# Patient Record
Sex: Female | Born: 1965 | Race: White | Hispanic: No | Marital: Married | Smoking: Current some day smoker
Health system: Southern US, Community
[De-identification: ages and names within clinical notes are randomized; demographics above are authoritative.]

## PROBLEM LIST (undated history)

## (undated) DIAGNOSIS — L02412 Cutaneous abscess of left axilla: Secondary | ICD-10-CM

## (undated) DIAGNOSIS — N871 Moderate cervical dysplasia: Secondary | ICD-10-CM

## (undated) DIAGNOSIS — E785 Hyperlipidemia, unspecified: Secondary | ICD-10-CM

## (undated) HISTORY — DX: Cutaneous abscess of left axilla: L02.412

## (undated) HISTORY — DX: Moderate cervical dysplasia: N87.1

---

## 1998-03-09 ENCOUNTER — Encounter: Admission: RE | Admit: 1998-03-09 | Discharge: 1998-03-09 | Payer: Self-pay | Admitting: *Deleted

## 2000-04-21 HISTORY — PX: CERVICAL BIOPSY  W/ LOOP ELECTRODE EXCISION: SUR135

## 2000-05-29 ENCOUNTER — Other Ambulatory Visit: Admission: RE | Admit: 2000-05-29 | Discharge: 2000-05-29 | Payer: Self-pay | Admitting: *Deleted

## 2000-05-29 ENCOUNTER — Encounter (INDEPENDENT_AMBULATORY_CARE_PROVIDER_SITE_OTHER): Payer: Self-pay | Admitting: Specialist

## 2000-09-03 ENCOUNTER — Other Ambulatory Visit: Admission: RE | Admit: 2000-09-03 | Discharge: 2000-09-03 | Payer: Self-pay | Admitting: *Deleted

## 2000-09-03 ENCOUNTER — Encounter (INDEPENDENT_AMBULATORY_CARE_PROVIDER_SITE_OTHER): Payer: Self-pay

## 2000-09-30 ENCOUNTER — Other Ambulatory Visit: Admission: RE | Admit: 2000-09-30 | Discharge: 2000-09-30 | Payer: Self-pay | Admitting: *Deleted

## 2000-09-30 ENCOUNTER — Encounter (INDEPENDENT_AMBULATORY_CARE_PROVIDER_SITE_OTHER): Payer: Self-pay | Admitting: Specialist

## 2000-10-03 ENCOUNTER — Emergency Department (HOSPITAL_COMMUNITY): Admission: EM | Admit: 2000-10-03 | Discharge: 2000-10-03 | Payer: Self-pay | Admitting: Emergency Medicine

## 2001-01-20 ENCOUNTER — Other Ambulatory Visit: Admission: RE | Admit: 2001-01-20 | Discharge: 2001-01-20 | Payer: Self-pay | Admitting: *Deleted

## 2001-06-24 ENCOUNTER — Other Ambulatory Visit: Admission: RE | Admit: 2001-06-24 | Discharge: 2001-06-24 | Payer: Self-pay | Admitting: *Deleted

## 2003-01-13 ENCOUNTER — Other Ambulatory Visit: Admission: RE | Admit: 2003-01-13 | Discharge: 2003-01-13 | Payer: Self-pay | Admitting: Obstetrics and Gynecology

## 2003-02-27 ENCOUNTER — Encounter: Admission: RE | Admit: 2003-02-27 | Discharge: 2003-02-27 | Payer: Self-pay

## 2004-01-25 ENCOUNTER — Other Ambulatory Visit: Admission: RE | Admit: 2004-01-25 | Discharge: 2004-01-25 | Payer: Self-pay | Admitting: Gynecology

## 2006-02-04 ENCOUNTER — Ambulatory Visit: Payer: Self-pay | Admitting: General Practice

## 2007-02-09 ENCOUNTER — Ambulatory Visit: Payer: Self-pay | Admitting: General Practice

## 2007-03-29 ENCOUNTER — Other Ambulatory Visit: Admission: RE | Admit: 2007-03-29 | Discharge: 2007-03-29 | Payer: Self-pay | Admitting: Gynecology

## 2008-02-16 ENCOUNTER — Ambulatory Visit: Payer: Self-pay | Admitting: General Practice

## 2008-03-29 ENCOUNTER — Encounter: Payer: Self-pay | Admitting: Gynecology

## 2008-03-29 ENCOUNTER — Ambulatory Visit: Payer: Self-pay | Admitting: Gynecology

## 2008-03-29 ENCOUNTER — Other Ambulatory Visit: Admission: RE | Admit: 2008-03-29 | Discharge: 2008-03-29 | Payer: Self-pay | Admitting: Gynecology

## 2009-02-21 ENCOUNTER — Ambulatory Visit: Payer: Self-pay | Admitting: General Practice

## 2009-04-02 ENCOUNTER — Other Ambulatory Visit: Admission: RE | Admit: 2009-04-02 | Discharge: 2009-04-02 | Payer: Self-pay | Admitting: Gynecology

## 2009-04-02 ENCOUNTER — Ambulatory Visit: Payer: Self-pay | Admitting: Gynecology

## 2010-02-13 ENCOUNTER — Ambulatory Visit: Payer: Self-pay | Admitting: General Practice

## 2010-03-05 ENCOUNTER — Encounter: Admission: RE | Admit: 2010-03-05 | Discharge: 2010-03-05 | Payer: Self-pay | Admitting: Internal Medicine

## 2010-04-03 ENCOUNTER — Ambulatory Visit: Payer: Self-pay | Admitting: Gynecology

## 2010-04-18 ENCOUNTER — Other Ambulatory Visit
Admission: RE | Admit: 2010-04-18 | Discharge: 2010-04-18 | Payer: Self-pay | Source: Home / Self Care | Admitting: Gynecology

## 2010-04-26 ENCOUNTER — Ambulatory Visit
Admission: RE | Admit: 2010-04-26 | Discharge: 2010-04-26 | Payer: Self-pay | Source: Home / Self Care | Attending: Gynecology | Admitting: Gynecology

## 2011-02-12 ENCOUNTER — Ambulatory Visit: Payer: Self-pay

## 2011-05-16 ENCOUNTER — Encounter: Payer: Self-pay | Admitting: Gynecology

## 2011-06-06 ENCOUNTER — Other Ambulatory Visit (HOSPITAL_COMMUNITY)
Admission: RE | Admit: 2011-06-06 | Discharge: 2011-06-06 | Disposition: A | Discharge status: Home / Self Care | Payer: BC Managed Care – PPO | Source: Ambulatory Visit | Attending: Gynecology | Admitting: Gynecology

## 2011-06-06 ENCOUNTER — Ambulatory Visit (INDEPENDENT_AMBULATORY_CARE_PROVIDER_SITE_OTHER): Payer: BC Managed Care – PPO | Admitting: Gynecology

## 2011-06-06 ENCOUNTER — Encounter: Payer: Self-pay | Admitting: Gynecology

## 2011-06-06 VITALS — BP 110/70 | Ht 65.25 in | Wt 133.0 lb

## 2011-06-06 DIAGNOSIS — Z01419 Encounter for gynecological examination (general) (routine) without abnormal findings: Secondary | ICD-10-CM | POA: Insufficient documentation

## 2011-06-06 DIAGNOSIS — N951 Menopausal and female climacteric states: Secondary | ICD-10-CM

## 2011-06-06 DIAGNOSIS — N871 Moderate cervical dysplasia: Secondary | ICD-10-CM | POA: Insufficient documentation

## 2011-06-06 LAB — CBC WITH DIFFERENTIAL/PLATELET
Basophils Absolute: 0.1 10*3/uL (ref 0.0–0.1)
Basophils Relative: 2 % — ABNORMAL HIGH (ref 0–1)
Eosinophils Absolute: 0.3 10*3/uL (ref 0.0–0.7)
Eosinophils Relative: 3 % (ref 0–5)
HCT: 46.3 % — ABNORMAL HIGH (ref 36.0–46.0)
Hemoglobin: 15.3 g/dL — ABNORMAL HIGH (ref 12.0–15.0)
Lymphocytes Relative: 33 % (ref 12–46)
Lymphs Abs: 2.4 10*3/uL (ref 0.7–4.0)
MCH: 28.4 pg (ref 26.0–34.0)
MCHC: 33 g/dL (ref 30.0–36.0)
MCV: 85.9 fL (ref 78.0–100.0)
Monocytes Absolute: 0.7 10*3/uL (ref 0.1–1.0)
Monocytes Relative: 10 % (ref 3–12)
Neutro Abs: 3.9 10*3/uL (ref 1.7–7.7)
Neutrophils Relative %: 53 % (ref 43–77)
Platelets: 292 10*3/uL (ref 150–400)
RBC: 5.39 MIL/uL — ABNORMAL HIGH (ref 3.87–5.11)
RDW: 12.9 % (ref 11.5–15.5)
WBC: 7.4 10*3/uL (ref 4.0–10.5)

## 2011-06-06 LAB — URINALYSIS W MICROSCOPIC + REFLEX CULTURE
Bilirubin Urine: NEGATIVE
Casts: NONE SEEN
Crystals: NONE SEEN
Glucose, UA: NEGATIVE mg/dL
Ketones, ur: NEGATIVE mg/dL
Leukocytes, UA: NEGATIVE
Nitrite: NEGATIVE
Protein, ur: NEGATIVE mg/dL
Specific Gravity, Urine: 1.015 (ref 1.005–1.030)
Urobilinogen, UA: 0.2 mg/dL (ref 0.0–1.0)
pH: 5.5 (ref 5.0–8.0)

## 2011-06-06 LAB — LIPID PANEL
Cholesterol: 204 mg/dL — ABNORMAL HIGH (ref 0–200)
HDL: 53 mg/dL (ref >39)
LDL Cholesterol: 136 mg/dL — ABNORMAL HIGH (ref 0–99)
Total CHOL/HDL Ratio: 3.8 Ratio
Triglycerides: 76 mg/dL (ref <150)
VLDL: 15 mg/dL (ref 0–40)

## 2011-06-06 LAB — FOLLICLE STIMULATING HORMONE: FSH: 118.4 m[IU]/mL — ABNORMAL HIGH

## 2011-06-06 LAB — PROLACTIN: Prolactin: 10.7 ng/mL

## 2011-06-06 LAB — TSH: TSH: 1.554 u[IU]/mL (ref 0.350–4.500)

## 2011-06-06 NOTE — Patient Instructions (Signed)
Remember to take either one of the following for calcium and vitamin D source: Caltrate Plus or Oscal, or Citracal or Viactiv. Take one daily.                                                                                              Menopause   Menopause is the normal time of life when menstrual periods stop completely. Menopause is complete when you have missed 12 consecutive menstrual periods. It usually occurs between the ages of 42 to 79, with an average age of 43. Very rarely does a woman develop menopause before 48 years old. At menopause, your ovaries stop producing the female hormones, estrogen and progesterone. This can cause undesirable symptoms and also affect your health. Sometimes the symptoms may occur 4 to 5 years before the menopause begins. There is no relationship between menopause and:  Oral contraceptives.   Number of children you had.   Race.   The age your menstrual periods started (menarche).  Heavy smokers and very thin women may develop menopause earlier in life. CAUSES  The ovaries stop producing the female hormones estrogen and progesterone.   Other causes include:   Surgery to remove both ovaries.   The ovaries stop functioning for no known reason.   Tumors of the pituitary gland in the brain.   Medical disease that affects the ovaries and hormone production.   Radiation treatment to the abdomen or pelvis.   Chemotherapy that affects the ovaries.  SYMPTOMS   Hot flashes.   Night sweats.   Decrease in sex drive.   Vaginal dryness and thinning of the vagina causing painful intercourse.   Dryness of the skin and developing wrinkles.   Headaches.   Tiredness.   Irritability.   Memory problems.   Weight gain.   Bladder infections.   Hair growth of the face and chest.   Infertility.  More serious symptoms include:  Loss of bone (osteoporosis) causing breaks (fractures).   Depression.   Hardening and narrowing of the arteries  (atherosclerosis) causing heart attacks and strokes.  DIAGNOSIS   When the menstrual periods have stopped for 12 straight months.   Physical exam.   Hormone studies of the blood.  TREATMENT  There are many treatment choices and nearly as many questions about them. The decisions to treat or not to treat menopausal changes is an individual choice made with your caregiver. Your caregiver can discuss the treatments with you. Together, you can decide which treatment will work best for you. Your treatment choices may include:   Hormone therapy (estorgen and progesterone).   Non-hormonal medications.   Treating the individual symptoms with medication (for example antidepressants for depression).   Herbal medications that may help specific symptoms.   Counseling by a psychiatrist or psychologist.   Group therapy.   Lifestyle changes including:   Eating healthy.   Regular exercise.   Limiting caffeine and alcohol.   Stress management and meditation.   No treatment.  HOME CARE INSTRUCTIONS   Take the medication your caregiver gives you as directed.   Get plenty of sleep and rest.  Exercise regularly.   Eat a diet that contains calcium (good for the bones) and soy products (acts like estrogen hormone).   Avoid alcoholic beverages.   Do not smoke.   If you have hot flashes, dress in layers.   Take supplements, calcium and vitamin D to strengthen bones.   You can use over-the-counter lubricants or moisturizers for vaginal dryness.   Group therapy is sometimes very helpful.   Acupuncture may be helpful in some cases.  SEEK MEDICAL CARE IF:   You are not sure you are in menopause.   You are having menopausal symptoms and need advice and treatment.   You are still having menstrual periods after age 27.   You have pain with intercourse.   Menopause is complete (no menstrual period for 12 months) and you develop vaginal bleeding.   You need a referral to a  specialist (gynecologist, psychiatrist or psychologist) for treatment.  SEEK IMMEDIATE MEDICAL CARE IF:   You have severe depression.   You have excessive vaginal bleeding.   You fell and think you have a broken bone.   You have pain when you urinate.   You develop leg or chest pain.   You have a fast pounding heart beat (palpitations).   You have severe headaches.   You develop vision problems.   You feel a lump in your breast.   You have abdominal pain or severe indigestion.  Document Released: 06/28/2003 Document Revised: 12/18/2010 Document Reviewed: 02/03/2008 [facility] Patient Information 2012 [place], [place].

## 2011-06-06 NOTE — Progress Notes (Signed)
Haley Blake 1966/01/24 [id number]   History:    46 y.o.  for annual exam with no complaints today. Patient did state her last menstrual period was in February this year and prior to that was in June 2011. She denies any vasomotor symptoms. Her last mammogram was in November 2012 which was normal. She does her monthly self breast examination. 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 She stopped smoking in 2012. Patient not using any form of contraception.  Past medical history,surgical history, family history and social history were all reviewed and documented in the EPIC chart.  Gynecologic History Patient's last menstrual period was 05/29/2011. Contraception: none Last Pap: 2011. Results were: normal Last mammogram: 2012. Results were: normal  Obstetric History OB History    Grav Para Term Preterm Abortions TAB SAB Ect Mult Living   2 2 2       2      # Outc Date GA Lbr Len/2nd Wgt Sex Del Anes PTL Lv   1 TRM     F SVD  No Yes   2 TRM     F SVD  No Yes       ROS:  Was performed and pertinent positives and negatives are included in the history.  Exam: chaperone present  BP 110/70  Ht 5' 5.25" (1.657 m)  Wt 133 lb (60.328 kg)  BMI 21.96 kg/m2  LMP 05/29/2011  Body mass index is 21.96 kg/(m^2).  General appearance : Well developed well nourished female. No acute distress HEENT: Neck supple, trachea midline, no carotid bruits, no thyroidmegaly Lungs: Clear to auscultation, no rhonchi or wheezes, or rib retractions  Heart: Regular rate and rhythm, no murmurs or gallops Breast:Examined in sitting and supine position were symmetrical in appearance, no palpable masses or tenderness,  no skin retraction, no nipple inversion, no nipple discharge, no skin discoloration, no axillary or supraclavicular lymphadenopathy Abdomen: no palpable masses or tenderness, no rebound or guarding Extremities: no edema  or skin discoloration or tenderness  Pelvic:  Bartholin, Urethra, Skene Glands: Within normal limits             Vagina: No gross lesions or discharge  Cervix: No gross lesions or discharge  Uterus  anteverted, normal size, shape and consistency, non-tender and mobile  Adnexa  Without masses or tenderness  Anus and perineum  normal   Rectovaginal  normal sphincter tone without palpated masses or tenderness             Hemoccult not done     Assessment/Plan:  46 y.o. female for annual exam perimenopausal. Patient with no vasomotor symptoms. Will check an [facility] today along with her fasting lipid profile. We'll also check a TSH and prolactin (weight stable and denies any galactorrhea, headaches or visual disturbances). We'll also check her CBC along with her urinalysis and Pap smear. We discussed importance of calcium and vitamin D for osteoporosis prevention and literature formation was provided. We discussed importance of weightbearing exercises 45 minutes to 2 times a week. She is in a great progress and was congratulated that she stopped smoking. Will notify her there is any abnormality within the above mentioned test otherwise we'll see her back in one year or when necessary. If her FSH is elevated and since she is asymptomatic we'll do nothing at the present time unless she becomes symptomatic.    Ok Edwards MD, 12:09 PM 06/06/2011

## 2011-06-08 LAB — URINE CULTURE
Colony Count: NO GROWTH
Organism ID, Bacteria: NO GROWTH

## 2012-01-19 ENCOUNTER — Ambulatory Visit: Payer: BC Managed Care – PPO

## 2012-01-19 ENCOUNTER — Ambulatory Visit (INDEPENDENT_AMBULATORY_CARE_PROVIDER_SITE_OTHER): Payer: BC Managed Care – PPO | Admitting: Internal Medicine

## 2012-01-19 VITALS — BP 125/78 | HR 70 | Temp 98.1°F | Resp 18 | Ht 65.0 in | Wt 135.6 lb

## 2012-01-19 DIAGNOSIS — M545 Low back pain: Secondary | ICD-10-CM

## 2012-01-19 DIAGNOSIS — R937 Abnormal findings on diagnostic imaging of other parts of musculoskeletal system: Secondary | ICD-10-CM

## 2012-01-19 MED ORDER — PREDNISONE 10 MG PO TABS: ORAL_TABLET | ORAL | Status: DC

## 2012-01-19 MED ORDER — CYCLOBENZAPRINE HCL 5 MG PO TABS: 5.0000 mg | ORAL_TABLET | Freq: Three times a day (TID) | ORAL | Status: DC | PRN

## 2012-01-19 MED ORDER — HYDROCODONE-ACETAMINOPHEN 5-325 MG PO TABS: 1.0000 | ORAL_TABLET | Freq: Four times a day (QID) | ORAL | Status: DC | PRN

## 2012-01-19 NOTE — Progress Notes (Signed)
   [address] [place] [address]   Phone [phone]  Subjective:    Patient ID: Haley Blake, female    DOB: [birth year], 46 y.o.   MRN: [id number]  HPI  Pt presents to clinic with 5 day h/o lumbar pain with radiation down both legs into bottoms of feet.  She was awoken from sleep Thursday with this pain and it has not improved since.  When she went to bed she had no problems and had not had an injury earlier that day.  She has taken off a few days from work because of the pain.  She has taken no meds and used no ice or heat.  The pain is in her lower back more on the R side but radiates down both legs.  She does not have numbness but does have tingling in her toes/bottom of her feet.  She has had no bowel/bladder problems.  She has increased pain when staying in a position for a period of time or when she changes position.  She has never had back problems in the past.  Review of Systems  Musculoskeletal: Positive for back pain and gait problem.  Neurological: Negative for weakness and numbness.       Objective:   Physical Exam  Vitals reviewed. Constitutional: She is oriented to person, place, and time. She appears well-developed and well-nourished.  HENT:  Head: Normocephalic and atraumatic.  Right Ear: External ear normal.  Left Ear: External ear normal.  Pulmonary/Chest: Effort normal.  Musculoskeletal:       Lumbar back: She exhibits tenderness and bony tenderness. She exhibits normal range of motion (pt able to almost fully flex and extend her back, she does have slight discomfort with extension), no swelling, no edema and no spasm.       Back:       Pt moves very slow to get out of chair and off of exam bed. Neg SLR.  Neurological: She is alert and oriented to person, place, and time. She has normal strength. She displays normal reflexes. No sensory deficit.  Reflex Scores:      Patellar reflexes are 3+ on the right side and 3+ on the left side.      Achilles reflexes  are 2+ on the right side and 2+ on the left side. Skin: Skin is warm and dry.  Psychiatric: She has a normal mood and affect. Her behavior is normal. Judgment and thought content normal.      [facility] reading (PRIMARY) by  Dr. Merla Riches.  Abnl area on L3L4 R pedicle.      Assessment & Plan:   1. Lumbar pain with radiation down right leg  DG Lumbar Spine Complete, predniSONE (DELTASONE) 10 MG tablet, HYDROcodone-acetaminophen (NORCO/VICODIN) 5-325 MG per tablet, cyclobenzaprine (FLEXERIL) 5 MG tablet  2. Abnormal x-ray of lumbar spine     Unsure exact etiology of pain.  Most likely is musculoskeletal but because of no injury known and pain bilaterally will want to have close follow-up with patient.  Pt can use heat/ice to area for pain relief.  D/w pt decrease lifting and reaching overhead to extend lumbar spine.  Will recheck pt in 1 wk unless pain/symptoms change/worsen.  Pt agrees and understands the above.  Pt was discussed with Dr. Merla Riches.

## 2012-01-26 ENCOUNTER — Ambulatory Visit (INDEPENDENT_AMBULATORY_CARE_PROVIDER_SITE_OTHER): Payer: BC Managed Care – PPO | Admitting: Internal Medicine

## 2012-01-26 VITALS — BP 120/72 | HR 68 | Temp 97.8°F | Resp 18 | Ht 67.0 in | Wt 135.0 lb

## 2012-01-26 DIAGNOSIS — R209 Unspecified disturbances of skin sensation: Secondary | ICD-10-CM

## 2012-01-26 DIAGNOSIS — R202 Paresthesia of skin: Secondary | ICD-10-CM

## 2012-01-26 DIAGNOSIS — M545 Low back pain: Secondary | ICD-10-CM

## 2012-01-26 NOTE — Progress Notes (Signed)
   [address] [place] [address]   Phone [phone]  Subjective:    Patient ID: Haley Blake, female    DOB: July 17, 1965, 46 y.o.   MRN: [id number]  HPI  Pt presents to clinic for recheck of her back pain.  The pain is improved after her prednisone taper but she still has significant pain in her low back with pain down her R leg only now with paresthesias in her R toes.  She did not work last week but she did return today but did not do heavy lifting.  She has no bowel or bladder problems.  The pain increases with change in position or staying in 1 position for a period of time.  Review of Systems  Musculoskeletal: Positive for back pain and gait problem (pt with ambulation initiation).  Neurological: Positive for numbness (R foot).       Objective:   Physical Exam  Vitals reviewed. Constitutional: She is oriented to person, place, and time. She appears well-developed and well-nourished.  HENT:  Head: Normocephalic and atraumatic.  Right Ear: External ear normal.  Left Ear: External ear normal.  Pulmonary/Chest: Effort normal.  Musculoskeletal:       Lumbar back: She exhibits tenderness, bony tenderness and spasm. She exhibits normal range of motion.       Back:       Some pain with SI joint strain.  Neg SLR.  Slow to start walking but then she has a normal gait.  Slow to change position.   Neurological: She is alert and oriented to person, place, and time. She has normal strength. A sensory deficit (decreased sensation in R foot - esp bottom of foot and big toe but sensation change starts at mid foot.) is present.  Reflex Scores:      Patellar reflexes are 3+ on the right side and 3+ on the left side.      Achilles reflexes are 2+ on the right side and 2+ on the left side. Skin: Skin is warm and dry.  Psychiatric: She has a normal mood and affect. Her behavior is normal. Judgment and thought content normal.   Reviewed xray report which showed loss of disc height in  L5-S1.    Assessment & Plan:   1. Lumbar pain with radiation down right leg  MR Lumbar Spine Wo Contrast  2. Right leg paresthesias  MR Lumbar Spine Wo Contrast  3. Abnormal sensation of lower extremity  MR Lumbar Spine Wo Contrast   Pt to continue pain meds prn and muscle relaxers.  Will schedule a MRI due to sensory deficits.  Pt will try to return to her work but d/w her that if her pain starts to worsen she needs to stop or at least decrease her lifting, pushing/pulling at work.  If anything changes and things get worse (esp sensation, or bowel/bladder problems) rtc.  Will determine next step after MRI results.  Pt brings in FMLA paperwork for work missed last week and I will fill them out for pt and then contact her to come and pick them up.  D/w Dr. Merla Riches.

## 2012-01-27 ENCOUNTER — Telehealth: Payer: Self-pay

## 2012-01-27 NOTE — Telephone Encounter (Signed)
FYI, I have given her more specific back restrictions, and can fax note, however, I want to make sure this is what the patient wants me to do. I have left message for the patient to call me back.

## 2012-01-27 NOTE — Telephone Encounter (Signed)
Haley Blake states she discussed with patient, and it is okay to fax this, so I have faxed it.

## 2012-01-27 NOTE — Telephone Encounter (Signed)
I have corrected this letter to 5lb restriction per Dr Merla Riches I called her at work and could not contact her.

## 2012-01-27 NOTE — Telephone Encounter (Signed)
Patients employer, [employer], request specifics to work duties. States minimal lifting is not specific and needs an exact number of lbs. If she does not hear from the office before 11am patient will be sent home for the day. Please call at [phone] ex [phone] and a note will also need to be faxed to [phone]

## 2012-01-27 NOTE — Telephone Encounter (Signed)
This patient was evaluated by Benny Lennert and our agreed approach was to schedule an MRI to evaluate her sensory defects Haley Blake is filling out FMLA papers according to her note If they insist you can put a limit of 5 pounds on her lifting

## 2012-01-29 ENCOUNTER — Ambulatory Visit
Admission: RE | Admit: 2012-01-29 | Discharge: 2012-01-29 | Disposition: A | Discharge status: Home / Self Care | Payer: BC Managed Care – PPO | Source: Ambulatory Visit | Attending: Physician Assistant | Admitting: Physician Assistant

## 2012-01-29 DIAGNOSIS — M545 Pain in right leg: Secondary | ICD-10-CM

## 2012-01-29 DIAGNOSIS — R202 Paresthesia of skin: Secondary | ICD-10-CM

## 2012-01-29 DIAGNOSIS — R209 Unspecified disturbances of skin sensation: Secondary | ICD-10-CM

## 2012-01-30 ENCOUNTER — Telehealth: Payer: Self-pay

## 2012-01-30 NOTE — Telephone Encounter (Signed)
Haley Blake, can you please review results and comment?

## 2012-01-30 NOTE — Telephone Encounter (Signed)
PT STATES SHE HAD AN MRI AND WOULD LIKE TO KNOW RESULTS. PLEASE CALL [phone] OR [phone]

## 2012-01-31 ENCOUNTER — Telehealth: Payer: Self-pay | Admitting: Physician Assistant

## 2012-01-31 DIAGNOSIS — M545 Low back pain: Secondary | ICD-10-CM

## 2012-01-31 DIAGNOSIS — R202 Paresthesia of skin: Secondary | ICD-10-CM

## 2012-01-31 NOTE — Telephone Encounter (Signed)
[facility] Ana has called patient

## 2012-01-31 NOTE — Telephone Encounter (Signed)
I reviewed her MRI results and she needs to have a referral.  ? Need for nerve conduction studies.

## 2012-01-31 NOTE — Telephone Encounter (Signed)
I have reviewed and sent to the CMA/rad tech pool.

## 2012-02-03 ENCOUNTER — Ambulatory Visit (INDEPENDENT_AMBULATORY_CARE_PROVIDER_SITE_OTHER): Payer: BC Managed Care – PPO | Admitting: Family Medicine

## 2012-02-03 VITALS — BP 102/67 | HR 84 | Temp 98.1°F | Resp 16 | Ht 65.25 in | Wt 136.0 lb

## 2012-02-03 DIAGNOSIS — M549 Dorsalgia, unspecified: Secondary | ICD-10-CM

## 2012-02-03 MED ORDER — NABUMETONE 500 MG PO TABS: 500.0000 mg | ORAL_TABLET | Freq: Two times a day (BID) | ORAL | Status: DC

## 2012-02-03 NOTE — Progress Notes (Signed)
[facility]:  Office Visit  Chief Complaint:  Chief Complaint  Patient presents with  . Follow-up    We  have started on a referral for pt to [facility] (see phone message) but pt's FMLA runs out tomorrow    HPI: Haley Blake is a 46 y.o. female who complains of  Low back pain, worse with walking, low back and radiates to hips and down legs. She has some tingling in her right toes. She tried to go to work but after Thursday she took Friday off due to the pain. Her MRI shows minimal dic protrusion at L5-S1 without nerve compression. She denies and incontinence. Has some numbness down right leg in foot but was given steroid the first visit and she said it may her crazy, hot on the inside.   Past Medical History  Diagnosis Date  . CIN II (cervical intraepithelial neoplasia II)    Past Surgical History  Procedure Date  . Cervical biopsy  w/ loop electrode excision 2002   History   Social History  . Marital Status: Married    Spouse Name: N/A    Number of Children: N/A  . Years of Education: N/A   Social History Main Topics  . Smoking status: Current Some Day Smoker -- 0.2 packs/day  . Smokeless tobacco: Never Used   Comment: stopped smoking 2012  . Alcohol Use: No  . Drug Use: No  . Sexually Active: Yes    Birth Control/ Protection: None   Other Topics Concern  . None   Social History Narrative  . None   Family History  Problem Relation Age of Onset  . Diabetes Father   . Diabetes Sister   . Diabetes Brother    No Known Allergies Prior to Admission medications   Medication Sig Start Date End Date Taking? Authorizing Provider  Multiple Vitamin (MULTIVITAMIN) tablet Take 1 tablet by mouth daily.   Yes Historical Provider, MD  cyclobenzaprine (FLEXERIL) 5 MG tablet Take 1 tablet (5 mg total) by mouth 3 (three) times daily as needed for muscle spasms. 01/19/12   Morrell Riddle, PA-C  HYDROcodone-acetaminophen (NORCO/VICODIN) 5-325 MG per tablet Take 1  tablet by mouth every 6 (six) hours as needed for pain. 01/19/12   Morrell Riddle, PA-C     ROS: The patient denies fevers, chills, night sweats, unintentional weight loss, chest pain, palpitations, wheezing, dyspnea on exertion, nausea, vomiting, abdominal pain, dysuria, hematuria, melena, numbness, weakness, or tingling.   All other systems have been reviewed and were otherwise negative with the exception of those mentioned in the HPI and as above.    PHYSICAL EXAM: Filed Vitals:   02/03/12 1801  BP: 102/67  Pulse: 84  Temp: 98.1 F (36.7 C)  Resp: 16   Filed Vitals:   02/03/12 1801  Height: 5' 5.25" (1.657 m)  Weight: 136 lb (61.689 kg)   Body mass index is 22.46 kg/(m^2).  General: Alert, no acute distress HEENT:  Normocephalic, atraumatic, oropharynx patent.  Cardiovascular:  Regular rate and rhythm, no rubs murmurs or gallops.  No Carotid bruits, radial pulse intact. No pedal edema.  Respiratory: Clear to auscultation bilaterally.  No wheezes, rales, or rhonchi.  No cyanosis, no use of accessory musculature GI: No organomegaly, abdomen is soft and non-tender, positive bowel sounds.  No masses. Skin: No rashes. Neurologic: Facial musculature symmetric. Psychiatric: Patient is appropriate throughout our interaction. Lymphatic: No cervical lymphadenopathy Musculoskeletal: Gait intact. Right leg + straight leg +  right buttock tenderness + 2/2 DTR ROm painful with extension, decreases ROM   LABS: Results for orders placed in visit on 06/06/11  LIPID PANEL      Component Value Range   Cholesterol 204 (*) 0 - 200 mg/dL   Triglycerides 76  <045 mg/dL   HDL 53  >40 mg/dL   Total CHOL/HDL Ratio 3.8     VLDL 15  0 - 40 mg/dL   LDL Cholesterol 981 (*) 0 - 99 mg/dL  FOLLICLE STIMULATING HORMONE      Component Value Range   FSH 118.4 (*)   CBC WITH DIFFERENTIAL      Component Value Range   WBC 7.4  4.0 - 10.5 K/uL   RBC 5.39 (*) 3.87 - 5.11 MIL/uL   Hemoglobin 15.3 (*)  12.0 - 15.0 g/dL   HCT 19.1 (*) 47.8 - 29.5 %   MCV 85.9  78.0 - 100.0 fL   MCH 28.4  26.0 - 34.0 pg   MCHC 33.0  30.0 - 36.0 g/dL   RDW 62.1  30.8 - 65.7 %   Platelets 292  150 - 400 K/uL   Neutrophils Relative 53  43 - 77 %   Neutro Abs 3.9  1.7 - 7.7 K/uL   Lymphocytes Relative 33  12 - 46 %   Lymphs Abs 2.4  0.7 - 4.0 K/uL   Monocytes Relative 10  3 - 12 %   Monocytes Absolute 0.7  0.1 - 1.0 K/uL   Eosinophils Relative 3  0 - 5 %   Eosinophils Absolute 0.3  0.0 - 0.7 K/uL   Basophils Relative 2 (*) 0 - 1 %   Basophils Absolute 0.1  0.0 - 0.1 K/uL   Smear Review Criteria for review not met    URINALYSIS WITH CULTURE REFLEX      Component Value Range   Color, Urine YELLOW  YELLOW   APPearance CLEAR  CLEAR   Specific Gravity, Urine 1.015  1.005 - 1.030   pH 5.5  5.0 - 8.0   Glucose, UA NEG  NEG mg/dL   Bilirubin Urine NEG  NEG   Ketones, ur NEG  NEG mg/dL   Hgb urine dipstick MOD (*) NEG   Protein, ur NEG  NEG mg/dL   Urobilinogen, UA 0.2  0.0 - 1.0 mg/dL   Nitrite NEG  NEG   Leukocytes, UA NEG  NEG   Squamous Epithelial / LPF FEW  RARE   Crystals NONE SEEN  NONE SEEN   Casts NONE SEEN  NONE SEEN   WBC, UA 0-2  <3 WBC/hpf   RBC / HPF 3-6 (*) <3 RBC/hpf   Bacteria, UA FEW (*) RARE  TSH      Component Value Range   TSH 1.554  0.350 - 4.500 uIU/mL  PROLACTIN      Component Value Range   Prolactin 10.7    URINE CULTURE      Component Value Range   Colony Count NO GROWTH     Organism ID, Bacteria NO GROWTH       EKG/XRAY:   Primary read interpreted by Dr. Conley Rolls at [facility].   ASSESSMENT/PLAN: Encounter Diagnosis  Name Primary?  . Back pain with radiation Yes   Rx [facility] referral pending Patient picked up FMLA paperwork and paid $15    [name], DO 02/03/2012 6:21 PM

## 2012-06-08 ENCOUNTER — Encounter: Payer: Self-pay | Admitting: Gynecology

## 2012-06-08 ENCOUNTER — Ambulatory Visit (INDEPENDENT_AMBULATORY_CARE_PROVIDER_SITE_OTHER): Payer: PRIVATE HEALTH INSURANCE | Admitting: Gynecology

## 2012-06-08 ENCOUNTER — Other Ambulatory Visit (HOSPITAL_COMMUNITY)
Admission: RE | Admit: 2012-06-08 | Discharge: 2012-06-08 | Disposition: A | Discharge status: Home / Self Care | Payer: PRIVATE HEALTH INSURANCE | Source: Ambulatory Visit | Attending: Gynecology | Admitting: Gynecology

## 2012-06-08 VITALS — BP 120/84 | Ht 65.0 in | Wt 141.0 lb

## 2012-06-08 DIAGNOSIS — N951 Menopausal and female climacteric states: Secondary | ICD-10-CM

## 2012-06-08 DIAGNOSIS — Z833 Family history of diabetes mellitus: Secondary | ICD-10-CM

## 2012-06-08 DIAGNOSIS — Z01419 Encounter for gynecological examination (general) (routine) without abnormal findings: Secondary | ICD-10-CM | POA: Insufficient documentation

## 2012-06-08 DIAGNOSIS — Z1151 Encounter for screening for human papillomavirus (HPV): Secondary | ICD-10-CM | POA: Insufficient documentation

## 2012-06-08 DIAGNOSIS — Z79890 Hormone replacement therapy (postmenopausal): Secondary | ICD-10-CM | POA: Insufficient documentation

## 2012-06-08 MED ORDER — MEDROXYPROGESTERONE ACETATE 10 MG PO TABS: ORAL_TABLET | ORAL | Status: DC

## 2012-06-08 MED ORDER — ESTRADIOL 0.52 MG/0.87 GM (0.06%) TD GEL: 1.0000 "application " | Freq: Every day | TRANSDERMAL | Status: DC

## 2012-06-08 NOTE — Progress Notes (Signed)
Haley Blake [birth year] [id number]   History:    48 y.o.  for annual gyn exam who has been complaining of vasomotor symptoms consisting of hot flashes mood swings and irritability as well as night sweats and insomnia. Last year an [facility] demonstrated that she was in the menopausal range with a value of 118. She stated her last menstrual period was April of 2012. Patient's currently being followed by the orthopedic service for lumbar herniated disc. She's currently not using any form of contraception. She stopped smoking in 2012.  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  Followup Pap smears have been normal. Patient's last mammogram 2012 [facility]. Patient frequently does her self breast examination. Patient's father and sister with history of diabetes. Patient's primary physician has been Dr. Cleta Alberts for which she has not seen him in over a year.   Past medical history,surgical history, family history and social history were all reviewed and documented in the EPIC chart.  Gynecologic History Patient's last menstrual period was 08/07/2011. Contraception: post menopausal status Last Pap: February 2013. Results were: normal Last mammogram: 2012. Results were: normal  Obstetric History OB History   Grav Para Term Preterm Abortions TAB SAB Ect Mult Living   2 2 2       2      # Outc Date GA Lbr Len/2nd Wgt Sex Del Anes PTL Lv   1 TRM     F SVD  No Yes   2 TRM     F SVD  No Yes       ROS: A ROS was performed and pertinent positives and negatives are included in the history.  GENERAL: No fevers or chills. HEENT: No change in vision, no earache, sore throat or sinus  congestion. NECK: No pain or stiffness. CARDIOVASCULAR: No chest pain or pressure. No palpitations. PULMONARY: No shortness of breath, cough or wheeze. GASTROINTESTINAL: No abdominal pain, nausea, vomiting or diarrhea, melena or bright red blood per rectum. GENITOURINARY: No urinary frequency, urgency, hesitancy or dysuria. MUSCULOSKELETAL: No joint or muscle pain, no back pain, no recent trauma. DERMATOLOGIC: No rash, no itching, no lesions. ENDOCRINE: No polyuria, polydipsia, no heat or cold intolerance. No recent change in weight. HEMATOLOGICAL: No anemia or easy bruising or bleeding. NEUROLOGIC: No headache, seizures, numbness, tingling or weakness. PSYCHIATRIC: No depression, no loss of interest in normal activity or change in sleep pattern.     Exam: chaperone present  BP 120/84  Ht 5\' 5"  (1.651 m)  Wt 141 lb (63.957 kg)  BMI 23.46 kg/m2  LMP 08/07/2011  Body mass index is 23.46 kg/(m^2).  General appearance : Well developed well nourished female. No acute distress HEENT: Neck supple, trachea midline, no carotid bruits, no thyroidmegaly Lungs: Clear to auscultation, no rhonchi or wheezes, or rib retractions  Heart: Regular rate and rhythm, no murmurs or gallops Breast:Examined in sitting and supine position were symmetrical in appearance, no palpable masses or tenderness,  no skin retraction, no nipple inversion, no nipple discharge, no skin discoloration, no axillary or supraclavicular lymphadenopathy Abdomen: no palpable masses or tenderness, no rebound or guarding Extremities: no edema or skin discoloration or tenderness  Pelvic:  Bartholin, Urethra, Skene Glands: Within normal limits  Vagina: No gross lesions or discharge  Cervix: No gross lesions or discharge  Uterus  anteverted, normal size, shape and consistency, non-tender and mobile  Adnexa  Without masses or tenderness  Anus and perineum  normal   Rectovaginal  normal sphincter tone without palpated masses or  tenderness             Hemoccult not indicated     Assessment/Plan:  47 y.o. female for annual exam with signs and symptoms and clinical evidence of the menopause. We had a lengthy discussion on hormone replacement therapy and potential risk of breast cancer. We discussed the women's health initiative study.  We also discussed different methods of administration of hormone replacement therapy. Patient accepts and would like to start hormone replacement therapy. She'll be started on elestrin 0.06% transdermal (one pump) to one arm daily with the addition of Provera 10 mg for 10 days of the month. Because of her history of dysplasia in the past we will continue to do Pap smears annually. Pap smear was done today. The following labs were work today: Hemoglobin A1c, CBC, urinalysis, fasting lipid profile and TSH. Requisition to schedule mammogram was provided as well. We discussed importance of calcium vitamin D and regular exercise for osteoporosis prevention.    Ok Edwards MD, 11:35 AM 06/08/2012

## 2012-06-08 NOTE — Patient Instructions (Signed)
Menopause Menopause is the normal time of life when menstrual periods stop completely. Menopause is complete when you have missed 12 consecutive menstrual periods. It usually occurs between the ages of 48 to 55, with an average age of 51. Very rarely does a woman develop menopause before 49 years old. At menopause, your ovaries stop producing the female hormones, estrogen and progesterone. This can cause undesirable symptoms and also affect your health. Sometimes the symptoms may occur 4 to 5 years before the menopause begins. There is no relationship between menopause and:  Oral contraceptives.  Number of children you had.  Race.  The age your menstrual periods started (menarche). Heavy smokers and very thin women may develop menopause earlier in life. CAUSES  The ovaries stop producing the female hormones estrogen and progesterone.  Other causes include:  Surgery to remove both ovaries.  The ovaries stop functioning for no known reason.  Tumors of the pituitary gland in the brain.  Medical disease that affects the ovaries and hormone production.  Radiation treatment to the abdomen or pelvis.  Chemotherapy that affects the ovaries. SYMPTOMS   Hot flashes.  Night sweats.  Decrease in sex drive.  Vaginal dryness and thinning of the vagina causing painful intercourse.  Dryness of the skin and developing wrinkles.  Headaches.  Tiredness.  Irritability.  Memory problems.  Weight gain.  Bladder infections.  Hair growth of the face and chest.  Infertility. More serious symptoms include:  Loss of bone (osteoporosis) causing breaks (fractures).  Depression.  Hardening and narrowing of the arteries (atherosclerosis) causing heart attacks and strokes. DIAGNOSIS   When the menstrual periods have stopped for 12 straight months.  Physical exam.  Hormone studies of the blood. TREATMENT  There are many treatment choices and nearly as many questions about them.  The decisions to treat or not to treat menopausal changes is an individual choice made with your caregiver. Your caregiver can discuss the treatments with you. Together, you can decide which treatment will work best for you. Your treatment choices may include:   Hormone therapy (estorgen and progesterone).  Non-hormonal medications.  Treating the individual symptoms with medication (for example antidepressants for depression).  Herbal medications that may help specific symptoms.  Counseling by a psychiatrist or psychologist.  Group therapy.  Lifestyle changes including:  Eating healthy.  Regular exercise.  Limiting caffeine and alcohol.  Stress management and meditation.  No treatment. HOME CARE INSTRUCTIONS   Take the medication your caregiver gives you as directed.  Get plenty of sleep and rest.  Exercise regularly.  Eat a diet that contains calcium (good for the bones) and soy products (acts like estrogen hormone).  Avoid alcoholic beverages.  Do not smoke.  If you have hot flashes, dress in layers.  Take supplements, calcium and vitamin D to strengthen bones.  You can use over-the-counter lubricants or moisturizers for vaginal dryness.  Group therapy is sometimes very helpful.  Acupuncture may be helpful in some cases. SEEK MEDICAL CARE IF:   You are not sure you are in menopause.  You are having menopausal symptoms and need advice and treatment.  You are still having menstrual periods after age 55.  You have pain with intercourse.  Menopause is complete (no menstrual period for 12 months) and you develop vaginal bleeding.  You need a referral to a specialist (gynecologist, psychiatrist or psychologist) for treatment. SEEK IMMEDIATE MEDICAL CARE IF:   You have severe depression.  You have excessive vaginal bleeding.  You fell and   think you have a broken bone.  You have pain when you urinate.  You develop leg or chest pain.  You have a fast  pounding heart beat (palpitations).  You have severe headaches.  You develop vision problems.  You feel a lump in your breast.  You have abdominal pain or severe indigestion. Document Released: 06/28/2003 Document Revised: 06/30/2011 Document Reviewed: 02/03/2008 Exit[employer] Patient Information 2013 [employer].   Hormone Therapy At menopause, your body begins making less estrogen and progesterone hormones. This causes the body to stop having menstrual periods. This is because estrogen and progesterone hormones control your periods and menstrual cycle. A lack of estrogen may cause symptoms such as:  Hot flushes (or hot flashes).  Vaginal dryness.  Dry skin.  Loss of sex drive.  Risk of bone loss (osteoporosis). When this happens, you may choose to take hormone therapy to get back the estrogen lost during menopause. When the hormone estrogen is given alone, it is usually referred to as ET (Estrogen Therapy). When the hormone progestin is combined with estrogen, it is generally called HT (Hormone Therapy). This was formerly known as hormone replacement therapy (HRT). Your caregiver can help you make a decision on what will be best for you. The decision to use HT seems to change often as new studies are done. Many studies do not agree on the benefits of hormone replacement therapy. LIKELY BENEFITS OF HT INCLUDE PROTECTION FROM:  Hot Flushes (also called hot flashes) - A hot flush is a sudden feeling of heat that spreads over the face and body. The skin may redden like a blush. It is connected with sweats and sleep disturbance. Women going through menopause may have hot flushes a few times a month or several times per day depending on the woman.  Osteoporosis (bone loss)- Estrogen helps guard against bone loss. After menopause, a woman's bones slowly lose calcium and become weak and brittle. As a result, bones are more likely to break. The hip, wrist, and spine are affected most often.  Hormone therapy can help slow bone loss after menopause. Weight bearing exercise and taking calcium with vitamin D also can help prevent bone loss. There are also medications that your caregiver can prescribe that can help prevent osteoporosis.  Vaginal Dryness - Loss of estrogen causes changes in the vagina. Its lining may become thin and dry. These changes can cause pain and bleeding during sexual intercourse. Dryness can also lead to infections. This can cause burning and itching. (Vaginal estrogen treatment can help relieve pain, itching, and dryness.)  Urinary Tract Infections are more common after menopause because of lack of estrogen. Some women also develop urinary incontinence because of low estrogen levels in the vagina and bladder.  Possible other benefits of estrogen include a positive effect on mood and short-term memory in women. RISKS AND COMPLICATIONS  Using estrogen alone without progesterone causes the lining of the uterus to grow. This increases the risk of lining of the uterus (endometrial) cancer. Your caregiver should give another hormone called progestin if you have a uterus.  Women who take combined (estrogen and progestin) HT appear to have an increased risk of breast cancer. The risk appears to be small, but increases throughout the time that HT is taken.  Combined therapy also makes the breast tissue slightly denser which makes it harder to read mammograms (breast X-rays).  Combined, estrogen and progesterone therapy can be taken together every day, in which case there may be spotting of blood. HT   therapy can be taken cyclically in which case you will have menstrual periods. Cyclically means HT is taken for a set amount of days, then not taken, then this process is repeated.  HT may increase the risk of stroke, heart attack, breast cancer and forming blood clots in your leg.  Transdermal estrogen (estrogen that is absorbed through the skin with a patch or a cream) may  have more positive results with:  Cholesterol.  Blood pressure.  Blood clots. Having the following conditions may indicate you should not have HT:  Endometrial cancer.  Liver disease.  Breast cancer.  Heart disease.  History of blood clots.  Stroke. TREATMENT   If you choose to take HT and have a uterus, usually estrogen and progestin are prescribed.  Your caregiver will help you decide the best way to take the medications.  Possible ways to take estrogen include:  Pills.  Patches.  Gels.  Sprays.  Vaginal estrogen cream, rings and tablets.  It is best to take the lowest dose possible that will help your symptoms and take them for the shortest period of time that you can.  Hormone therapy can help relieve some of the problems (symptoms) that affect women at menopause. Before making a decision about HT, talk to your caregiver about what is best for you. Be well informed and comfortable with your decisions. HOME CARE INSTRUCTIONS   Follow your caregivers advice when taking the medications.  A Pap test is done to screen for cervical cancer.  The first Pap test should be done at age 21.  Between ages 21 and 29, Pap tests are repeated every 2 years.  Beginning at age 30, you are advised to have a Pap test every 3 years as long as your past 3 Pap tests have been normal.  Some women have medical problems that increase the chance of getting cervical cancer. Talk to your caregiver about these problems. It is especially important to talk to your caregiver if a new problem develops soon after your last Pap test. In these cases, your caregiver may recommend more frequent screening and Pap tests.  The above recommendations are the same for women who have or have not gotten the vaccine for HPV (Human Papillomavirus).  If you had a hysterectomy for a problem that was not a cancer or a condition that could lead to cancer, then you no longer need Pap tests. However, even if  you no longer need a Pap test, a regular exam is a good idea to make sure no other problems are starting.   If you are between ages 65 and 70, and you have had normal Pap tests going back 10 years, you no longer need Pap tests. However, even if you no longer need a Pap test, a regular exam is a good idea to make sure no other problems are starting.   If you have had past treatment for cervical cancer or a condition that could lead to cancer, you need Pap tests and screening for cancer for at least 20 years after your treatment.  If Pap tests have been discontinued, risk factors (such as a new sexual partner) need to be re-assessed to determine if screening should be resumed.  Some women may need screenings more often if they are at high risk for cervical cancer.  Get mammograms done as per the advice of your caregiver. SEEK IMMEDIATE MEDICAL CARE IF:  You develop abnormal vaginal bleeding.  You have pain or swelling in your legs,   shortness of breath, or chest pain.  You develop dizziness or headaches.  You have lumps or changes in your breasts or armpits.  You have slurred speech.  You develop weakness or numbness of your arms or legs.  You have pain, burning, or bleeding when urinating.  You develop abdominal pain. Document Released: 01/04/2003 Document Revised: 06/30/2011 Document Reviewed: 04/24/2010 Exit[employer] Patient Information 2013 [employer].   

## 2012-06-09 LAB — URINALYSIS W MICROSCOPIC + REFLEX CULTURE
Bacteria, UA: NONE SEEN
Casts: NONE SEEN
Crystals: NONE SEEN
Glucose, UA: NEGATIVE mg/dL
Hgb urine dipstick: NEGATIVE
Ketones, ur: NEGATIVE mg/dL
Leukocytes, UA: NEGATIVE
Nitrite: NEGATIVE
Protein, ur: NEGATIVE mg/dL
Specific Gravity, Urine: 1.012 (ref 1.005–1.030)
Squamous Epithelial / HPF: NONE SEEN
Urobilinogen, UA: 0.2 mg/dL (ref 0.0–1.0)
pH: 5.5 (ref 5.0–8.0)

## 2012-06-11 ENCOUNTER — Other Ambulatory Visit: Payer: Self-pay | Admitting: Gynecology

## 2012-06-11 MED ORDER — FLUCONAZOLE 100 MG PO TABS: 100.0000 mg | ORAL_TABLET | Freq: Every day | ORAL | Status: DC

## 2012-08-09 ENCOUNTER — Other Ambulatory Visit: Payer: Self-pay | Admitting: Family Medicine

## 2012-08-09 DIAGNOSIS — G8929 Other chronic pain: Secondary | ICD-10-CM

## 2012-08-09 DIAGNOSIS — M5126 Other intervertebral disc displacement, lumbar region: Secondary | ICD-10-CM

## 2012-08-13 ENCOUNTER — Ambulatory Visit
Admission: RE | Admit: 2012-08-13 | Discharge: 2012-08-13 | Disposition: A | Discharge status: Home / Self Care | Payer: PRIVATE HEALTH INSURANCE | Source: Ambulatory Visit | Attending: Family Medicine | Admitting: Family Medicine

## 2012-08-13 VITALS — BP 99/58 | HR 74

## 2012-08-13 DIAGNOSIS — G8929 Other chronic pain: Secondary | ICD-10-CM

## 2012-08-13 DIAGNOSIS — M5126 Other intervertebral disc displacement, lumbar region: Secondary | ICD-10-CM

## 2012-08-13 MED ORDER — IOHEXOL 180 MG/ML  SOLN
15.0000 mL | Freq: Once | INTRAMUSCULAR | Status: AC | PRN
  Administered 2012-08-13: 18 mL via INTRATHECAL

## 2012-08-13 MED ORDER — DIAZEPAM 5 MG PO TABS
10.0000 mg | ORAL_TABLET | Freq: Once | ORAL | Status: AC
  Administered 2012-08-13: 5 mg via ORAL

## 2013-02-20 ENCOUNTER — Ambulatory Visit: Payer: PRIVATE HEALTH INSURANCE

## 2013-02-20 ENCOUNTER — Ambulatory Visit (INDEPENDENT_AMBULATORY_CARE_PROVIDER_SITE_OTHER): Payer: PRIVATE HEALTH INSURANCE | Admitting: Family Medicine

## 2013-02-20 VITALS — BP 106/60 | HR 74 | Temp 98.1°F | Resp 16 | Ht 66.0 in | Wt 138.4 lb

## 2013-02-20 DIAGNOSIS — S6991XA Unspecified injury of right wrist, hand and finger(s), initial encounter: Secondary | ICD-10-CM

## 2013-02-20 DIAGNOSIS — S6990XA Unspecified injury of unspecified wrist, hand and finger(s), initial encounter: Secondary | ICD-10-CM

## 2013-02-20 DIAGNOSIS — M79641 Pain in right hand: Secondary | ICD-10-CM

## 2013-02-20 DIAGNOSIS — M79609 Pain in unspecified limb: Secondary | ICD-10-CM

## 2013-02-20 NOTE — Progress Notes (Signed)
This chart was scribed for Norberto Sorenson, MD by Ardelia Mems, Scribe. This patient was seen in room 5 and the patient's care was started at 11:05 AM.  Subjective:    Patient ID: Haley Blake, female    DOB: 07/20/1965, 47 y.o.   MRN: [id number]  Chief Complaint  Patient presents with  . Hand Injury    right, 1 month ago, fall, swelling, can't fully close hand    HPI  HPI Comments: Haley Blake is a 47 y.o. female who presents to [facility] complaining of persistent, moderate right hand pain, swelling, and weakness since a fall forward onto the dorsal aspect of her flexed hand 1 mo prev. There is constant swelling of her 2nd-4th MCP joints with pain between those joints and extending along 2nd metacarpal to wrist. She states that she is unable to fully extend or flex her right fingers and has lost ability to grasp which often causes wrist pain. She states that she has been applying ice and doing salt water massages on the hand without relief. No loss of sensation. She states that she takes Vicodin prn regularly for chronic back pain, 1-2 tabs/day, so she does not need any additional pain medication. She states that she has not been taking anti-inflammatory medications.   Past Medical History  Diagnosis Date  . CIN II (cervical intraepithelial neoplasia II)    Current Outpatient Prescriptions on File Prior to Visit  Medication Sig Dispense Refill  . HYDROcodone-acetaminophen (NORCO/VICODIN) 5-325 MG per tablet Take 1 tablet by mouth every 6 (six) hours as needed for pain.  20 tablet  0  . Multiple Vitamin (MULTIVITAMIN) tablet Take 1 tablet by mouth daily.      . Estradiol 0.52 MG/0.87 GM (0.06%) GEL Apply 1 application topically daily. Apply to one arm daily only  1 Bottle  11  . fluconazole (DIFLUCAN) 100 MG tablet Take 1 tablet (100 mg total) by mouth daily.  1 tablet  0  . medroxyPROGESTERone (PROVERA) 10 MG tablet Take one tablet daily for the first ten days of the month   30 tablet  4  . nabumetone (RELAFEN) 500 MG tablet Take 1 tablet (500 mg total) by mouth 2 (two) times daily.  60 tablet  0   No current facility-administered medications on file prior to visit.   No Known Allergies   Review of Systems  Constitutional: Positive for activity change. Negative for fever, chills and unexpected weight change.  Musculoskeletal: Positive for arthralgias, back pain and joint swelling.       Right hand pain and swelling.  Skin: Negative for color change and rash.  Neurological: Negative for weakness and numbness.      BP 106/60  Pulse 74  Temp(Src) 98.1 F (36.7 C)  Resp 16  Ht 5\' 6"  (1.676 m)  Wt 138 lb 6.4 oz (62.778 kg)  BMI 22.35 kg/m2  SpO2 100% Objective:   Physical Exam  Nursing note and vitals reviewed. Constitutional: She is oriented to person, place, and time. She appears well-developed and well-nourished. No distress.  HENT:  Head: Normocephalic and atraumatic.  Eyes: EOM are normal.  Neck: Neck supple. No tracheal deviation present.  Cardiovascular: Normal rate.   Pulmonary/Chest: Effort normal. No respiratory distress.  Musculoskeletal: She exhibits tenderness.       Right wrist: She exhibits decreased range of motion and tenderness. She exhibits no swelling and no effusion.       Right hand: She exhibits decreased range of  motion, tenderness, bony tenderness and swelling. She exhibits normal capillary refill. Normal sensation noted. Decreased strength noted. She exhibits wrist extension trouble. She exhibits no finger abduction and no thumb/finger opposition.  Pain over 2nd metacarpal. 2nd through 4th MCP joints are swollen and painful. Very limited extension and flexion of all fingers of the right hand. Severe ROM limitations in all MCP joints of right hand.   Neurological: She is alert and oriented to person, place, and time.  Skin: Skin is warm and dry.  Psychiatric: She has a normal mood and affect. Her behavior is normal.     Primary x-ray reading by Dr. Clelia Croft: Hand and wrist: No acute abnormality seen    Assessment & Plan:  Hand pain, right - Plan: DG Hand Complete Right, DG Wrist Complete Right, Ambulatory referral to Hand Surgery  Hand injuries, right, initial encounter - Plan: Ambulatory referral to Hand Surgery I do think due to significant loss of strength, P/AROM, and continued impressive swelling 1 month out from injury, that it is impt pt be evaluated by hand surgery.  Advised to wear right wrist splint and cont w/ freq icing until appt. No orders of the defined types were placed in this encounter.    I personally performed the services described in this documentation, which was scribed in my presence. The recorded information has been reviewed and considered, and addended by me as needed.  Norberto Sorenson, MD MPH

## 2013-02-20 NOTE — Patient Instructions (Signed)
Intermetacarpal Sprain  The intermetacarpal ligaments run between the knuckles, at the base of the fingers. These ligaments are vulnerable to sprain and injury, in which the ligament becomes over stretched or torn. Intermetacarpal sprains are classified into 3 categories. Grade 1 sprains cause pain, but the tendon is not lengthened. Grade 2 sprains include a lengthened ligament, due to the ligament being stretched or partially ruptured. With grade 2 sprains there is still function, although function may be decreased. Grade 3 sprains include a complete tear of the ligament, and the joint usually displays a loss of function.   SYMPTOMS   · Severe pain at the time of injury.  · Often, a feeling of popping or tearing inside the hand.  · Tenderness and inflammation at the knuckles.  · Bruising within a couple days of injury.  · Impaired ability to use the hand.  CAUSES   This condition occurs when the intermeatacarpal ligaments are subjected to a greater stress than they can handle. This causes the ligaments to become stretched or torn.  RISK INCREASES WITH:  · Previous hand injury.  · Fighting sports (boxing, wrestling, martial arts).  · Sports in which you could fall on an outstretched hand (soccer, basketball, volleyball).  · Other sports with repeated hand trauma (water polo, gymnastics).  · Poor hand strength and flexibility.  · Inadequate or poorly fitted protective equipment.  PREVENTION   · Warm up and stretch properly before activity.  · Maintain appropriate conditioning:  · Hand flexibility.  · Muscle strength and endurance.  · Applying tape, protective strapping, or a brace may help prevent injury.  · Provide the hand with support during sports and practice activities, for 6 to 12 months following injury.  PROGNOSIS   With proper treatment, healing should occur without impairment. The length of healing varies from 2 to 12 weeks, depending on the severity of injury.  RELATED COMPLICATIONS   · Longer healing  time, if activities are resumed too soon.  · Recurring symptoms or repeated injury, resulting in a chronic problem.  · Injury to other nearby structures (bone, cartilage, tendon).  · Arthritis of the knuckle (intermetacarpal) joint, with repeated sprains.  · Prolonged disability (sometimes).  · Hand and finger stiffness or weakness.  TREATMENT  Treatment first involves ice and medicine, to reduce pain and inflammation. An elastic compression bandage may be worn, to reduce discomfort and to protect the area. Depending on the severity of injury, you may be required to restrain the area with a cast, splint, or brace. After the ligament has been allowed to heal, strengthening and stretching exercises may be needed, to regain strength and a full range of motion. Exercises may be completed at home or with a therapist. Surgery is rarely needed.  MEDICATION   · If pain medicine is needed, nonsteroidal anti-inflammatory medicines (aspirin and ibuprofen), or other minor pain relievers (acetaminophen), are often advised.  · Do not take pain medicine for 7 days before surgery.  · Stronger pain relievers may be prescribed, if your caregiver thinks they are needed. Use only as directed and only as much as you need.  HEAT AND COLD  · Cold treatment (icing) should be applied for 10 to 15 minutes every 2 to 3 hours for inflammation and pain, and immediately after activity that aggravates your symptoms. Use ice packs or an ice massage.  · Heat treatment may be used before performing stretching and strengthening activities prescribed by your caregiver, physical therapist, or athletic trainer. Use   a heat pack or a warm water soak.  SEEK MEDICAL CARE IF:   · Symptoms remain or get worse, despite treatment for longer than 2 to 4 weeks.  · You experience pain, numbness, discoloration, or coldness in the hand or fingers.  · You develop blue, gray, or dark fingernails.  · Any of the following occur after surgery: increased pain, swelling,  redness, drainage of fluids, bleeding in the affected area, or signs of infection, including fever.  · New, unexplained symptoms develop. (Drugs used in treatment may produce side effects.)  Document Released: 04/07/2005 Document Revised: 06/30/2011 Document Reviewed: 07/20/2008  ExitCare® Patient Information ©2014 [employer].

## 2013-02-24 ENCOUNTER — Other Ambulatory Visit: Payer: Self-pay

## 2013-03-22 DIAGNOSIS — Z0271 Encounter for disability determination: Secondary | ICD-10-CM

## 2013-06-24 ENCOUNTER — Other Ambulatory Visit (HOSPITAL_COMMUNITY)
Admission: RE | Admit: 2013-06-24 | Discharge: 2013-06-24 | Disposition: A | Discharge status: Home / Self Care | Payer: BC Managed Care – PPO | Source: Ambulatory Visit | Attending: Gynecology | Admitting: Gynecology

## 2013-06-24 ENCOUNTER — Ambulatory Visit (INDEPENDENT_AMBULATORY_CARE_PROVIDER_SITE_OTHER): Payer: BC Managed Care – PPO | Admitting: Gynecology

## 2013-06-24 ENCOUNTER — Encounter: Payer: Self-pay | Admitting: Gynecology

## 2013-06-24 VITALS — BP 120/78 | Ht 65.0 in | Wt 134.6 lb

## 2013-06-24 DIAGNOSIS — N951 Menopausal and female climacteric states: Secondary | ICD-10-CM

## 2013-06-24 DIAGNOSIS — Z01419 Encounter for gynecological examination (general) (routine) without abnormal findings: Secondary | ICD-10-CM | POA: Insufficient documentation

## 2013-06-24 DIAGNOSIS — Z87410 Personal history of cervical dysplasia: Secondary | ICD-10-CM

## 2013-06-24 DIAGNOSIS — Z780 Asymptomatic menopausal state: Secondary | ICD-10-CM

## 2013-06-24 DIAGNOSIS — Z1159 Encounter for screening for other viral diseases: Secondary | ICD-10-CM

## 2013-06-24 DIAGNOSIS — R634 Abnormal weight loss: Secondary | ICD-10-CM

## 2013-06-24 LAB — CBC WITH DIFFERENTIAL/PLATELET
BASOS ABS: 0.1 10*3/uL (ref 0.0–0.1)
BASOS PCT: 1 % (ref 0–1)
EOS ABS: 0.1 10*3/uL (ref 0.0–0.7)
EOS PCT: 2 % (ref 0–5)
HEMATOCRIT: 42.8 % (ref 36.0–46.0)
Hemoglobin: 14.3 g/dL (ref 12.0–15.0)
LYMPHS PCT: 31 % (ref 12–46)
Lymphs Abs: 2.2 10*3/uL (ref 0.7–4.0)
MCH: 27.6 pg (ref 26.0–34.0)
MCHC: 33.4 g/dL (ref 30.0–36.0)
MCV: 82.6 fL (ref 78.0–100.0)
MONO ABS: 0.6 10*3/uL (ref 0.1–1.0)
Monocytes Relative: 8 % (ref 3–12)
Neutro Abs: 4.1 10*3/uL (ref 1.7–7.7)
Neutrophils Relative %: 58 % (ref 43–77)
PLATELETS: 299 10*3/uL (ref 150–400)
RBC: 5.18 MIL/uL — ABNORMAL HIGH (ref 3.87–5.11)
RDW: 13.4 % (ref 11.5–15.5)
WBC: 7 10*3/uL (ref 4.0–10.5)

## 2013-06-24 LAB — LIPID PANEL
CHOL/HDL RATIO: 4.2 ratio
Cholesterol: 236 mg/dL — ABNORMAL HIGH (ref 0–200)
HDL: 56 mg/dL (ref >39)
LDL CALC: 159 mg/dL — AB (ref 0–99)
TRIGLYCERIDES: 107 mg/dL (ref <150)
VLDL: 21 mg/dL (ref 0–40)

## 2013-06-24 LAB — COMPREHENSIVE METABOLIC PANEL
ALBUMIN: 4.5 g/dL (ref 3.5–5.2)
ALK PHOS: 92 U/L (ref 39–117)
ALT: 27 U/L (ref 0–35)
AST: 22 U/L (ref 0–37)
BILIRUBIN TOTAL: 0.8 mg/dL (ref 0.2–1.2)
BUN: 17 mg/dL (ref 6–23)
CO2: 23 mEq/L (ref 19–32)
CREATININE: 0.87 mg/dL (ref 0.50–1.10)
Calcium: 9.6 mg/dL (ref 8.4–10.5)
Chloride: 106 mEq/L (ref 96–112)
GLUCOSE: 88 mg/dL (ref 70–99)
POTASSIUM: 3.9 meq/L (ref 3.5–5.3)
Sodium: 139 mEq/L (ref 135–145)
Total Protein: 7.5 g/dL (ref 6.0–8.3)

## 2013-06-24 NOTE — Progress Notes (Signed)
Haley Blake Oct 23, 1965 [id number]   History:    48 y.o.  for annual GYN exam and is having no complaints today. Review of patient's records indicated that last year she was started on HRT as a result of her elevated FSH and vasomotor symptoms. Patient took hormones for short time. She is totally asymptomatic. Patient stopped smoking several years ago. She is taking her calcium and vitamin D. 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   Followup Pap smears have been normal. Patient's [facility]. Patient frequently does her self breast examination. Patient's father and sister with history of diabetes. Patient's primary physician has been Dr. Cleta Albertsaub for which she has not seen him in over a year.   Past medical history,surgical history, family history and social history were all reviewed and documented in the EPIC chart.  Gynecologic History No LMP recorded. Patient is not currently having periods (Reason: Perimenopausal). Contraception: post menopausal status Last Pap: 2014. Results were: normal Last mammogram: 2014. Results were: Patient stated it was normal [facility]. Obstetric History OB History  Gravida Para Term Preterm AB SAB TAB Ectopic Multiple Living  2 2 2       2     # Outcome Date GA Lbr Len/2nd Weight Sex Delivery Anes PTL Lv  2 TRM     F SVD  N Y  1 TRM     F SVD  N Y       ROS: A ROS was performed and pertinent positives and negatives are included in the history.  GENERAL: No fevers or chills. HEENT: No change in vision, no earache, sore throat or sinus  congestion. NECK: No pain or stiffness. CARDIOVASCULAR: No chest pain or pressure. No palpitations. PULMONARY: No shortness of breath, cough or wheeze. GASTROINTESTINAL: No abdominal pain, nausea, vomiting or diarrhea, melena or bright red blood per rectum. GENITOURINARY: No urinary frequency, urgency, hesitancy or dysuria. MUSCULOSKELETAL: No joint or muscle pain, no back pain, no recent trauma. DERMATOLOGIC: No rash, no itching, no lesions. ENDOCRINE: No polyuria, polydipsia, no heat or cold intolerance. No recent change in weight. HEMATOLOGICAL: No anemia or easy bruising or bleeding. NEUROLOGIC: No headache, seizures, numbness, tingling or weakness. PSYCHIATRIC: No depression, no loss of interest in normal activity or change in sleep pattern.     Exam: chaperone present  BP 120/78  Ht 5\' 5"  (1.651 m)  Wt 134 lb 9.6 oz (61.054 kg)  BMI 22.40 kg/m2  Body mass index is 22.4 kg/(m^2).  General appearance : Well developed well nourished female. No acute distress HEENT: Neck supple, trachea midline, no carotid bruits, no thyroidmegaly Lungs: Clear to auscultation, no rhonchi or wheezes, or rib retractions  Heart: Regular rate and rhythm, no murmurs or gallops Breast:Examined in sitting and supine position were symmetrical in appearance, no palpable masses or tenderness,  no skin retraction, no nipple inversion, no nipple discharge, no skin discoloration, no axillary or supraclavicular lymphadenopathy Abdomen: no palpable masses or tenderness, no rebound or guarding Extremities: no edema or skin discoloration or tenderness  Pelvic:  Bartholin, Urethra, Skene Glands: Within normal limits  Vagina: No gross lesions or discharge  Cervix: No gross lesions or discharge  Uterus  anteverted, normal size, shape and consistency, non-tender and mobile  Adnexa  Without masses or tenderness  Anus and perineum  normal   Rectovaginal  normal sphincter tone without palpated masses or tenderness              Hemoccult that indicated     Assessment/Plan:  48 y.o. female for annual exam no longer on HRT asymptomatic. Patient with past history of CIN-2/CIN-3 status post LEEP cervical conization in 2002 with negative margins and followup Pap smears negative. Pap smear done today and we will continue to do so yearly. She was reminded to do her monthly breast exam. We discussed importance of calcium and vitamin D and regular exercises for osteoporosis prevention. We will get a baseline bone density study at age 40. She will be her first colonoscopy at age 47 as well. The following labs were ordered: Fasting lipid profile, comprehensive metabolic panel, TSH, urinalysis. Requisition to schedule mammogram was provided as well.  New CDC guidelines is recommending patients be tested once in her lifetime for hepatitis C antibody who were born between [birth year] through [birth year]. This was discussed with the patient today and has agreed to be tested today.  Note: This dictation was prepared with  Dragon/digital dictation along withSmart phrase technology. Any transcriptional errors that result from this process are unintentional.   Ok Edwards MD, 9:03 AM 06/24/2013

## 2013-06-25 LAB — URINALYSIS W MICROSCOPIC + REFLEX CULTURE
BILIRUBIN URINE: NEGATIVE
CASTS: NONE SEEN
CRYSTALS: NONE SEEN
Glucose, UA: NEGATIVE mg/dL
HGB URINE DIPSTICK: NEGATIVE
KETONES UR: NEGATIVE mg/dL
Leukocytes, UA: NEGATIVE
Nitrite: NEGATIVE
Protein, ur: NEGATIVE mg/dL
SPECIFIC GRAVITY, URINE: 1.024 (ref 1.005–1.030)
UROBILINOGEN UA: 0.2 mg/dL (ref 0.0–1.0)
pH: 5.5 (ref 5.0–8.0)

## 2013-06-25 LAB — TSH: TSH: 1.247 u[IU]/mL (ref 0.350–4.500)

## 2013-06-25 LAB — HEPATITIS C ANTIBODY: HCV Ab: NEGATIVE

## 2013-06-26 LAB — URINE CULTURE
COLONY COUNT: NO GROWTH
Organism ID, Bacteria: NO GROWTH

## 2014-02-20 ENCOUNTER — Encounter: Payer: Self-pay | Admitting: Gynecology

## 2014-04-17 ENCOUNTER — Other Ambulatory Visit: Payer: Self-pay

## 2014-04-17 ENCOUNTER — Ambulatory Visit
Admission: RE | Admit: 2014-04-17 | Discharge: 2014-04-17 | Disposition: A | Discharge status: Home / Self Care | Payer: BC Managed Care – PPO | Source: Ambulatory Visit

## 2014-04-17 DIAGNOSIS — Z1231 Encounter for screening mammogram for malignant neoplasm of breast: Secondary | ICD-10-CM

## 2014-04-30 ENCOUNTER — Ambulatory Visit (INDEPENDENT_AMBULATORY_CARE_PROVIDER_SITE_OTHER): Payer: BLUE CROSS/BLUE SHIELD | Admitting: Internal Medicine

## 2014-04-30 VITALS — BP 102/68 | HR 72 | Temp 98.5°F | Resp 18 | Ht 66.0 in | Wt 136.4 lb

## 2014-04-30 DIAGNOSIS — M5137 Other intervertebral disc degeneration, lumbosacral region: Secondary | ICD-10-CM

## 2014-04-30 DIAGNOSIS — M5417 Radiculopathy, lumbosacral region: Secondary | ICD-10-CM

## 2014-04-30 DIAGNOSIS — M79604 Pain in right leg: Secondary | ICD-10-CM

## 2014-04-30 DIAGNOSIS — M545 Low back pain: Secondary | ICD-10-CM

## 2014-04-30 MED ORDER — HYDROCODONE-ACETAMINOPHEN 5-325 MG PO TABS: 1.0000 | ORAL_TABLET | Freq: Four times a day (QID) | ORAL | Status: DC | PRN

## 2014-04-30 MED ORDER — METHOCARBAMOL 750 MG PO TABS: 750.0000 mg | ORAL_TABLET | Freq: Four times a day (QID) | ORAL | Status: DC

## 2014-04-30 NOTE — Patient Instructions (Signed)

## 2014-04-30 NOTE — Progress Notes (Signed)
   Subjective:    Patient ID: Haley Blake, female    DOB: 05/11/1965, [age] y.o.   MRN: [id number]  HPI In lots of pain, has chronic ls pain with DDD and arthritis. Has had full w/up in 2013 and 2014 with MRI,CT, and myelogram all found to be non surgical   Review of Systems     Objective:   Physical Exam  Constitutional: She is oriented to person, place, and time. She appears well-developed and well-nourished.  HENT:  Head: Normocephalic.  Eyes: Conjunctivae and EOM are normal. Pupils are equal, round, and reactive to light.  Neck: Normal range of motion. Neck supple.  Cardiovascular: Normal rate.   Pulmonary/Chest: Effort normal.  Musculoskeletal: She exhibits tenderness.  Neurological: She is alert and oriented to person, place, and time. She has normal reflexes. She exhibits normal muscle tone. Coordination normal.  Psychiatric: She has a normal mood and affect.          Assessment & Plan:  LB strain with DDD and arthritis recurrent

## 2014-05-23 ENCOUNTER — Ambulatory Visit (INDEPENDENT_AMBULATORY_CARE_PROVIDER_SITE_OTHER): Payer: BLUE CROSS/BLUE SHIELD | Admitting: Sports Medicine

## 2014-05-23 VITALS — BP 104/62 | HR 72 | Temp 98.5°F | Resp 16 | Ht 65.0 in | Wt 134.0 lb

## 2014-05-23 DIAGNOSIS — L02412 Cutaneous abscess of left axilla: Secondary | ICD-10-CM | POA: Insufficient documentation

## 2014-05-23 MED ORDER — DOXYCYCLINE HYCLATE 100 MG PO CAPS: 100.0000 mg | ORAL_CAPSULE | Freq: Two times a day (BID) | ORAL | Status: AC

## 2014-05-23 NOTE — Progress Notes (Signed)
Verbal Consent Obtained. Local anesthesia with 4 cc of 2% lidocaine with epi.  11 blade used to incise the lesion centrally.  Minimal purulence expressed. Irrigated wound with 2 cc of 2% lidocaine. Wound not packed as was only approx 0.5" deep. Cleansed and dressed.

## 2014-05-23 NOTE — Patient Instructions (Signed)
° ° °

## 2014-05-23 NOTE — Progress Notes (Signed)
   Subjective:    Patient ID: Haley LagerAda Blake, female    DOB: May 02, 1965, 49 y.o.   MRN: [id number]  HPI Haley Blake is a 49 year-old female who presents with concern over a "cyst" under her left armpit. Onset was about 5 days ago. It has been enlarging ever since. It is tender and red. No drainage. No fevers, chills, or malaise. She had a hx of 1 prior on the right 7-8 years ago, tx with po antibiotics. Does not shave in this area. Has not tried any relieving factors. Painful with pressing on it.  Past medical history, social history, medications, and allergies were reviewed and are up to date in the chart.  Review of Systems 7 point review of systems was performed and was otherwise negative unless noted in the history of present illness.     Objective:   Physical Exam BP 104/62 mmHg  Pulse 72  Temp(Src) 98.5 F (36.9 C) (Oral)  Resp 16  Ht 5\' 5"  (1.651 m)  Wt 134 lb (60.782 kg)  BMI 22.30 kg/m2  SpO2 98%  LMP 08/07/2011 General appearance: alert, cooperative and appears stated age Skin: 1x2cm soft raised fluctuant mass in left axilla. Mild surround erythema. Palpably indurated tract possibly extending supero-laterally. Lymph nodes: Axillary adenopathy: left axilla, soft, tender, mobile     Assessment & Plan:  1. Left Axilla Abscess  -Incised and drained with PA assistance. Purulent drainage expressed. Wound was explored. No packing needed. -Rx doxycycline 100mg  bid x 10 days -Warm compresses, wound cares discussed -Follow-up if fever, chills, spreading rash, or increasing pain or drainage.  Dr. Joellyn HaffPick-Jacobs, DO Sports Medicine Fellow

## 2014-05-24 NOTE — Progress Notes (Signed)
I personally have reviewed this document and agree with A/P. Dr Conley RollsLe

## 2014-07-07 ENCOUNTER — Ambulatory Visit (INDEPENDENT_AMBULATORY_CARE_PROVIDER_SITE_OTHER): Payer: BLUE CROSS/BLUE SHIELD | Admitting: Gynecology

## 2014-07-07 ENCOUNTER — Encounter: Payer: Self-pay | Admitting: Gynecology

## 2014-07-07 ENCOUNTER — Other Ambulatory Visit (HOSPITAL_COMMUNITY)
Admission: RE | Admit: 2014-07-07 | Discharge: 2014-07-07 | Disposition: A | Discharge status: Home / Self Care | Payer: BLUE CROSS/BLUE SHIELD | Source: Ambulatory Visit | Attending: Gynecology | Admitting: Gynecology

## 2014-07-07 VITALS — BP 116/78 | Ht 65.0 in | Wt 137.0 lb

## 2014-07-07 DIAGNOSIS — Z01419 Encounter for gynecological examination (general) (routine) without abnormal findings: Secondary | ICD-10-CM

## 2014-07-07 DIAGNOSIS — Z87410 Personal history of cervical dysplasia: Secondary | ICD-10-CM | POA: Diagnosis not present

## 2014-07-07 DIAGNOSIS — Z780 Asymptomatic menopausal state: Secondary | ICD-10-CM | POA: Diagnosis not present

## 2014-07-07 LAB — TSH: TSH: 1.224 u[IU]/mL (ref 0.350–4.500)

## 2014-07-07 LAB — CBC WITH DIFFERENTIAL/PLATELET
BASOS ABS: 0.1 10*3/uL (ref 0.0–0.1)
BASOS PCT: 1 % (ref 0–1)
Eosinophils Absolute: 0.2 10*3/uL (ref 0.0–0.7)
Eosinophils Relative: 3 % (ref 0–5)
HEMATOCRIT: 42.4 % (ref 36.0–46.0)
HEMOGLOBIN: 14.3 g/dL (ref 12.0–15.0)
Lymphocytes Relative: 29 % (ref 12–46)
Lymphs Abs: 2.3 10*3/uL (ref 0.7–4.0)
MCH: 28.1 pg (ref 26.0–34.0)
MCHC: 33.7 g/dL (ref 30.0–36.0)
MCV: 83.5 fL (ref 78.0–100.0)
MPV: 9.2 fL (ref 8.6–12.4)
Monocytes Absolute: 0.6 10*3/uL (ref 0.1–1.0)
Monocytes Relative: 7 % (ref 3–12)
NEUTROS ABS: 4.8 10*3/uL (ref 1.7–7.7)
NEUTROS PCT: 60 % (ref 43–77)
Platelets: 230 10*3/uL (ref 150–400)
RBC: 5.08 MIL/uL (ref 3.87–5.11)
RDW: 13.3 % (ref 11.5–15.5)
WBC: 8 10*3/uL (ref 4.0–10.5)

## 2014-07-07 LAB — COMPREHENSIVE METABOLIC PANEL
ALBUMIN: 4.4 g/dL (ref 3.5–5.2)
ALT: 23 U/L (ref 0–35)
AST: 21 U/L (ref 0–37)
Alkaline Phosphatase: 93 U/L (ref 39–117)
BUN: 18 mg/dL (ref 6–23)
CALCIUM: 9.6 mg/dL (ref 8.4–10.5)
CO2: 26 meq/L (ref 19–32)
Chloride: 106 mEq/L (ref 96–112)
Creat: 0.82 mg/dL (ref 0.50–1.10)
Glucose, Bld: 82 mg/dL (ref 70–99)
POTASSIUM: 4.4 meq/L (ref 3.5–5.3)
SODIUM: 140 meq/L (ref 135–145)
Total Bilirubin: 0.7 mg/dL (ref 0.2–1.2)
Total Protein: 7.1 g/dL (ref 6.0–8.3)

## 2014-07-07 LAB — LIPID PANEL
CHOL/HDL RATIO: 3.4 ratio
Cholesterol: 203 mg/dL — ABNORMAL HIGH (ref 0–200)
HDL: 60 mg/dL (ref >=46)
LDL Cholesterol: 126 mg/dL — ABNORMAL HIGH (ref 0–99)
Triglycerides: 87 mg/dL (ref <150)
VLDL: 17 mg/dL (ref 0–40)

## 2014-07-07 NOTE — Patient Instructions (Addendum)
Bone Densitometry Bone densitometry is a special X-ray that measures your bone density and can be used to help predict your risk of bone fractures. This test is used to determine bone mineral content and density to diagnose osteoporosis. Osteoporosis is the loss of bone that may cause the bone to become weak. Osteoporosis commonly occurs in women entering menopause. However, it may be found in men and in people with other diseases. PREPARATION FOR TEST No preparation necessary. WHO SHOULD BE TESTED?  All women older than 65.  Postmenopausal women (50 to 65) with risk factors for osteoporosis.  People with a previous fracture caused by normal activities.  People with a small body frame (less than 127 poundsor a body mass index [BMI] of less than 21).  People who have a parent with a hip fracture or history of osteoporosis.  People who smoke.  People who have rheumatoid arthritis.  Anyone who engages in excessive alcohol use (more than 3 drinks most days).  Women who experience early menopause. WHEN SHOULD YOU BE RETESTED? Current guidelines suggest that you should wait at least 2 years before doing a bone density test again if your first test was normal.Recent studies indicated that women with normal bone density may be able to wait a few years before needing to repeat a bone density test. You should discuss this with your caregiver.  NORMAL FINDINGS   Normal: less than standard deviation below normal (greater than -1).  Osteopenia: 1 to 2.5 standard deviations below normal (-1 to -2.5).  Osteoporosis: greater than 2.5 standard deviations below normal (less than -2.5). Test results are reported as a "T score" and a "Z score."The T score is a number that compares your bone density with the bone density of healthy, young women.The Z score is a number that compares your bone density with the scores of women who are the same age, gender, and race.  Ranges for normal findings may vary  among different laboratories and hospitals. You should always check with your doctor after having lab work or other tests done to discuss the meaning of your test results and whether your values are considered within normal limits. MEANING OF TEST  Your caregiver will go over the test results with you and discuss the importance and meaning of your results, as well as treatment options and the need for additional tests if necessary. OBTAINING THE TEST RESULTS It is your responsibility to obtain your test results. Ask the lab or department performing the test when and how you will get your results. Document Released: 04/29/2004 Document Revised: 06/30/2011 Document Reviewed: 05/22/2010 ExitCare Patient Information 2015 [employer]. This information is not intended to replace advice given to you by your health care provider. Make sure you discuss any questions you have with your health care provider. Colonoscopy A colonoscopy is an exam to look at the entire large intestine (colon). This exam can help find problems such as tumors, polyps, inflammation, and areas of bleeding. The exam takes about 1 hour.  LET YOUR HEALTH CARE PROVIDER KNOW ABOUT:  Any allergies you have. All medicines you are taking, including vitamins, herbs, eye drops, creams, and over-the-counter medicines. Previous problems you or members of your family have had with the use of anesthetics. Any blood disorders you have. Previous surgeries you have had. Medical conditions you have. RISKS AND COMPLICATIONS  Generally, this is a safe procedure. However, as with any procedure, complications can occur. Possible complications include: Bleeding. Tearing or rupture of the colon wall.   Reaction to medicines given during the exam. Infection (rare). BEFORE THE PROCEDURE  Ask your health care provider about changing or stopping your regular medicines. You may be prescribed an oral bowel prep. This involves drinking a large amount of  medicated liquid, starting the day before your procedure. The liquid will cause you to have multiple loose stools until your stool is almost clear or light green. This cleans out your colon in preparation for the procedure. Do not eat or drink anything else once you have started the bowel prep, unless your health care provider tells you it is safe to do so. Arrange for someone to drive you home after the procedure. PROCEDURE  You will be given medicine to help you relax (sedative). You will lie on your side with your knees bent. A long, flexible tube with a light and camera on the end (colonoscope) will be inserted through the rectum and into the colon. The camera sends video back to a computer screen as it moves through the colon. The colonoscope also releases carbon dioxide gas to inflate the colon. This helps your health care provider see the area better. During the exam, your health care provider may take a small tissue sample (biopsy) to be examined under a microscope if any abnormalities are found. The exam is finished when the entire colon has been viewed. AFTER THE PROCEDURE  Do not drive for 24 hours after the exam. You may have a small amount of blood in your stool. You may pass moderate amounts of gas and have mild abdominal cramping or bloating. This is caused by the gas used to inflate your colon during the exam. Ask when your test results will be ready and how you will get your results. Make sure you get your test results. Document Released: 04/04/2000 Document Revised: 01/26/2013 Document Reviewed: 12/13/2012 ExitCare Patient Information 2015 [employer]. This information is not intended to replace advice given to you by your health care provider. Make sure you discuss any questions you have with your health care provider.  

## 2014-07-07 NOTE — Progress Notes (Signed)
Haley Blake Nov 13, 1965 [id number]   History:    49 y.o.  for annual gyn exam with no complaints today. Review of her record indicated that in 2014 she was started on hormone replacement therapy as a result of her elevated FSH and vasomotor symptoms. Patient took the hormones for short time and is no longer on any hormone replacement therapy. Patient denies any vasomotor symptoms. 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  Followup Pap smears have been normal. She has not had a colonoscopy as of yet or a bone density study. Patient's father and sister both with history of diabetes. Patient has not been followed by her primary care physician 1 to have her fasting blood work done here in the office.  Past medical history,surgical history, family history and social history were all reviewed and documented in the EPIC chart.  Gynecologic History Patient's last menstrual period was 08/07/2011. Contraception: post menopausal status Last Pap: 2015. Results were: normal Last mammogram: 2015. Results were: normal  Obstetric History OB History  Gravida Para Term Preterm AB SAB TAB Ectopic Multiple Living  2 2 2       2     # Outcome Date GA Lbr Len/2nd Weight Sex Delivery Anes PTL Lv  2 Term     F Vag-Spont  N Y  1 Term     F Vag-Spont  N Y       ROS: A ROS was performed and pertinent positives and negatives are included in the history.  GENERAL: No fevers or chills. HEENT: No change in vision, no earache, sore throat or sinus congestion. NECK: No pain or  stiffness. CARDIOVASCULAR: No chest pain or pressure. No palpitations. PULMONARY: No shortness of breath, cough or wheeze. GASTROINTESTINAL: No abdominal pain, nausea, vomiting or diarrhea, melena or bright red blood per rectum. GENITOURINARY: No urinary frequency, urgency, hesitancy or dysuria. MUSCULOSKELETAL: No joint or muscle pain, no back pain, no recent trauma. DERMATOLOGIC: No rash, no itching, no lesions. ENDOCRINE: No polyuria, polydipsia, no heat or cold intolerance. No recent change in weight. HEMATOLOGICAL: No anemia or easy bruising or bleeding. NEUROLOGIC: No headache, seizures, numbness, tingling or weakness. PSYCHIATRIC: No depression, no loss of interest in normal activity or change in sleep pattern.     Exam: chaperone present  BP 116/78 mmHg  Ht 5\' 5"  (1.651 m)  Wt 137 lb (62.143 kg)  BMI 22.80 kg/m2  LMP 08/07/2011  Body mass index is 22.8 kg/(m^2).  General appearance : Well developed well nourished female. No acute distress HEENT: Eyes: no retinal hemorrhage or exudates,  Neck supple, trachea midline, no carotid bruits, no thyroidmegaly Lungs: Clear to auscultation, no rhonchi or wheezes, or rib retractions  Heart: Regular rate and rhythm, no murmurs or gallops Breast:Examined in sitting and supine position were symmetrical in appearance, no palpable masses or tenderness,  no skin retraction, no nipple inversion, no nipple discharge, no skin discoloration, no axillary or supraclavicular lymphadenopathy Abdomen: no palpable masses or tenderness, no rebound or guarding Extremities: no edema or skin discoloration or tenderness  Pelvic:  Bartholin, Urethra, Skene Glands: Within normal limits  Vagina: No gross lesions or discharge  Cervix: No gross lesions or discharge  Uterus  anteverted, normal size, shape and consistency, non-tender and mobile  Adnexa  Without masses or tenderness  Anus and perineum  normal   Rectovaginal  refused rectal exam              Hemoccult cards provided     Assessment/Plan:  49 y.o. female for annual exam who is menopausal and no longer on hormone replacement therapy. Because of her history of CIN-2 in the past with 1 to continue to do yearly Pap smears for close surveillance. She was reminded that later this year which she goes for her mammogram to request a three-dimensional mammogram because her breasts were dense this year although normal. She was also encouraged to do her monthly breast examination. She was counseled once again the detrimental effects of smoking. We discussed importance of calcium vitamin D and regular exercise for osteoporosis prevention. She will schedule a bone density study here in our office in the next few weeks. She was also encouraged to schedule colonoscopy for screening sometime this year as well. The following screening labs were ordered: CBC, comprehensive metabolic panel, fasting lipid profile, TSH, and urinalysis.   Ok Edwards MD, 9:22 AM 07/07/2014

## 2014-07-08 LAB — URINALYSIS W MICROSCOPIC + REFLEX CULTURE
BACTERIA UA: NONE SEEN
BILIRUBIN URINE: NEGATIVE
CRYSTALS: NONE SEEN
Casts: NONE SEEN
Glucose, UA: NEGATIVE mg/dL
Hgb urine dipstick: NEGATIVE
KETONES UR: NEGATIVE mg/dL
Leukocytes, UA: NEGATIVE
Nitrite: NEGATIVE
Protein, ur: NEGATIVE mg/dL
Specific Gravity, Urine: 1.025 (ref 1.005–1.030)
Squamous Epithelial / LPF: NONE SEEN
UROBILINOGEN UA: 0.2 mg/dL (ref 0.0–1.0)
pH: 5.5 (ref 5.0–8.0)

## 2014-07-11 LAB — CYTOLOGY - PAP

## 2014-08-09 ENCOUNTER — Other Ambulatory Visit: Payer: BLUE CROSS/BLUE SHIELD | Admitting: Anesthesiology

## 2014-08-09 DIAGNOSIS — Z780 Asymptomatic menopausal state: Secondary | ICD-10-CM

## 2014-08-09 DIAGNOSIS — Z1211 Encounter for screening for malignant neoplasm of colon: Secondary | ICD-10-CM | POA: Diagnosis not present

## 2014-08-28 ENCOUNTER — Telehealth: Payer: Self-pay | Admitting: Radiology

## 2014-08-28 NOTE — Telephone Encounter (Signed)
Pt is wanting a refill of her hydrocodone. Last filled 04/30/14.

## 2014-08-29 ENCOUNTER — Ambulatory Visit (INDEPENDENT_AMBULATORY_CARE_PROVIDER_SITE_OTHER): Payer: BLUE CROSS/BLUE SHIELD | Admitting: Physician Assistant

## 2014-08-29 VITALS — BP 108/66 | HR 72 | Temp 98.1°F | Resp 16 | Ht 65.0 in | Wt 135.2 lb

## 2014-08-29 DIAGNOSIS — M5137 Other intervertebral disc degeneration, lumbosacral region: Secondary | ICD-10-CM | POA: Diagnosis not present

## 2014-08-29 DIAGNOSIS — M545 Low back pain: Secondary | ICD-10-CM | POA: Diagnosis not present

## 2014-08-29 DIAGNOSIS — M79604 Pain in right leg: Secondary | ICD-10-CM

## 2014-08-29 MED ORDER — HYDROCODONE-ACETAMINOPHEN 5-325 MG PO TABS: 1.0000 | ORAL_TABLET | Freq: Four times a day (QID) | ORAL | Status: AC | PRN

## 2014-08-29 NOTE — Patient Instructions (Signed)
[facility] Policy for Prescribing Controlled Substances (Revised 02/2012) 1. Prescriptions for controlled substances will be filled by ONE provider at [facility] with whom you have established and developed a plan for your care, including follow-up. 2. You are encouraged to schedule an appointment with your prescriber at our appointment center for follow-up visits whenever possible. 3. If you request a prescription for the controlled substance while at [facility] for an acute problem (with someone other than your regular prescriber), you MAY be given a ONE-TIME prescription for a 30-day supply of the controlled substance, to allow time for you to return to see your regular prescriber for additional prescriptions.  I've referred you to PT today.  Please do the exercises below in the meantime.  Please take the norco as needed for pain.   Sciatica with Rehab The sciatic nerve runs from the back down the leg and is responsible for sensation and control of the muscles in the back (posterior) side of the thigh, lower leg, and foot. Sciatica is a condition that is characterized by inflammation of this nerve.  SYMPTOMS   Signs of nerve damage, including numbness and/or weakness along the posterior side of the lower extremity.  Pain in the back of the thigh that may also travel down the leg.  Pain that worsens when sitting for long periods of time.  Occasionally, pain in the back or buttock. CAUSES  Inflammation of the sciatic nerve is the cause of sciatica. The inflammation is due to something irritating the nerve. Common sources of irritation include:  Sitting for long periods of time.  Direct trauma to the nerve.  Arthritis of the spine.  Herniated or ruptured disk.  Slipping of the vertebrae (spondylolisthesis).  Pressure from soft tissues, such as muscles or ligament-like tissue (fascia). RISK INCREASES WITH:  Sports that place pressure or stress on the spine (football or weightlifting).  Poor  strength and flexibility.  Failure to warm up properly before activity.  Family history of low back pain or disk disorders.  Previous back injury or surgery.  Poor body mechanics, especially when lifting, or poor posture. PREVENTION   Warm up and stretch properly before activity.  Maintain physical fitness:  Strength, flexibility, and endurance.  Cardiovascular fitness.  Learn and use proper technique, especially with posture and lifting. When possible, have coach correct improper technique.  Avoid activities that place stress on the spine. PROGNOSIS If treated properly, then sciatica usually resolves within 6 weeks. However, occasionally surgery is necessary.  RELATED COMPLICATIONS   Permanent nerve damage, including pain, numbness, tingle, or weakness.  Chronic back pain.  Risks of surgery: infection, bleeding, nerve damage, or damage to surrounding tissues. TREATMENT Treatment initially involves resting from any activities that aggravate your symptoms. The use of ice and medication may help reduce pain and inflammation. The use of strengthening and stretching exercises may help reduce pain with activity. These exercises may be performed at home or with referral to a therapist. A therapist may recommend further treatments, such as transcutaneous electronic nerve stimulation (TENS) or ultrasound. Your caregiver may recommend corticosteroid injections to help reduce inflammation of the sciatic nerve. If symptoms persist despite non-surgical (conservative) treatment, then surgery may be recommended. MEDICATION  If pain medication is necessary, then nonsteroidal anti-inflammatory medications, such as aspirin and ibuprofen, or other minor pain relievers, such as acetaminophen, are often recommended.  Do not take pain medication for 7 days before surgery.  Prescription pain relievers may be given if deemed necessary by your caregiver. Use only  as directed and only as much as you  need.  Ointments applied to the skin may be helpful.  Corticosteroid injections may be given by your caregiver. These injections should be reserved for the most serious cases, because they may only be given a certain number of times. HEAT AND COLD  Cold treatment (icing) relieves pain and reduces inflammation. Cold treatment should be applied for 10 to 15 minutes every 2 to 3 hours for inflammation and pain and immediately after any activity that aggravates your symptoms. Use ice packs or massage the area with a piece of ice (ice massage).  Heat treatment may be used prior to performing the stretching and strengthening activities prescribed by your caregiver, physical therapist, or athletic trainer. Use a heat pack or soak the injury in warm water. SEEK MEDICAL CARE IF:  Treatment seems to offer no benefit, or the condition worsens.  Any medications produce adverse side effects. EXERCISES  RANGE OF MOTION (ROM) AND STRETCHING EXERCISES - Sciatica Most people with sciatic will find that their symptoms worsen with either excessive bending forward (flexion) or arching at the low back (extension). The exercises which will help resolve your symptoms will focus on the opposite motion. Your physician, physical therapist or athletic trainer will help you determine which exercises will be most helpful to resolve your low back pain. Do not complete any exercises without first consulting with your clinician. Discontinue any exercises which worsen your symptoms until you speak to your clinician. If you have pain, numbness or tingling which travels down into your buttocks, leg or foot, the goal of the therapy is for these symptoms to move closer to your back and eventually resolve. Occasionally, these leg symptoms will get better, but your low back pain may worsen; this is typically an indication of progress in your rehabilitation. Be certain to be very alert to any changes in your symptoms and the activities in  which you participated in the 24 hours prior to the change. Sharing this information with your clinician will allow him/her to most efficiently treat your condition. These exercises may help you when beginning to rehabilitate your injury. Your symptoms may resolve with or without further involvement from your physician, physical therapist or athletic trainer. While completing these exercises, remember:   Restoring tissue flexibility helps normal motion to return to the joints. This allows healthier, less painful movement and activity.  An effective stretch should be held for at least 30 seconds.  A stretch should never be painful. You should only feel a gentle lengthening or release in the stretched tissue. FLEXION RANGE OF MOTION AND STRETCHING EXERCISES: STRETCH - Flexion, Single Knee to Chest   Lie on a firm bed or floor with both legs extended in front of you.  Keeping one leg in contact with the floor, bring your opposite knee to your chest. Hold your leg in place by either grabbing behind your thigh or at your knee.  Pull until you feel a gentle stretch in your low back. Hold __________ seconds.  Slowly release your grasp and repeat the exercise with the opposite side. Repeat __________ times. Complete this exercise __________ times per day.  STRETCH - Flexion, Double Knee to Chest  Lie on a firm bed or floor with both legs extended in front of you.  Keeping one leg in contact with the floor, bring your opposite knee to your chest.  Tense your stomach muscles to support your back and then lift your other knee to your chest. Hold  your legs in place by either grabbing behind your thighs or at your knees.  Pull both knees toward your chest until you feel a gentle stretch in your low back. Hold __________ seconds.  Tense your stomach muscles and slowly return one leg at a time to the floor. Repeat __________ times. Complete this exercise __________ times per day.  STRETCH - Low Trunk  Rotation   Lie on a firm bed or floor. Keeping your legs in front of you, bend your knees so they are both pointed toward the ceiling and your feet are flat on the floor.  Extend your arms out to the side. This will stabilize your upper body by keeping your shoulders in contact with the floor.  Gently and slowly drop both knees together to one side until you feel a gentle stretch in your low back. Hold for __________ seconds.  Tense your stomach muscles to support your low back as you bring your knees back to the starting position. Repeat the exercise to the other side. Repeat __________ times. Complete this exercise __________ times per day  EXTENSION RANGE OF MOTION AND FLEXIBILITY EXERCISES: STRETCH - Extension, Prone on Elbows  Lie on your stomach on the floor, a bed will be too soft. Place your palms about shoulder width apart and at the height of your head.  Place your elbows under your shoulders. If this is too painful, stack pillows under your chest.  Allow your body to relax so that your hips drop lower and make contact more completely with the floor.  Hold this position for __________ seconds.  Slowly return to lying flat on the floor. Repeat __________ times. Complete this exercise __________ times per day.  RANGE OF MOTION - Extension, Prone Press Ups  Lie on your stomach on the floor, a bed will be too soft. Place your palms about shoulder width apart and at the height of your head.  Keeping your back as relaxed as possible, slowly straighten your elbows while keeping your hips on the floor. You may adjust the placement of your hands to maximize your comfort. As you gain motion, your hands will come more underneath your shoulders.  Hold this position __________ seconds.  Slowly return to lying flat on the floor. Repeat __________ times. Complete this exercise __________ times per day.  STRENGTHENING EXERCISES - Sciatica  These exercises may help you when beginning to  rehabilitate your injury. These exercises should be done near your "sweet spot." This is the neutral, low-back arch, somewhere between fully rounded and fully arched, that is your least painful position. When performed in this safe range of motion, these exercises can be used for people who have either a flexion or extension based injury. These exercises may resolve your symptoms with or without further involvement from your physician, physical therapist or athletic trainer. While completing these exercises, remember:   Muscles can gain both the endurance and the strength needed for everyday activities through controlled exercises.  Complete these exercises as instructed by your physician, physical therapist or athletic trainer. Progress with the resistance and repetition exercises only as your caregiver advises.  You may experience muscle soreness or fatigue, but the pain or discomfort you are trying to eliminate should never worsen during these exercises. If this pain does worsen, stop and make certain you are following the directions exactly. If the pain is still present after adjustments, discontinue the exercise until you can discuss the trouble with your clinician. STRENGTHENING - Deep Abdominals, Pelvic Tilt  Lie on a firm bed or floor. Keeping your legs in front of you, bend your knees so they are both pointed toward the ceiling and your feet are flat on the floor.  Tense your lower abdominal muscles to press your low back into the floor. This motion will rotate your pelvis so that your tail bone is scooping upwards rather than pointing at your feet or into the floor.  With a gentle tension and even breathing, hold this position for __________ seconds. Repeat __________ times. Complete this exercise __________ times per day.  STRENGTHENING - Abdominals, Crunches   Lie on a firm bed or floor. Keeping your legs in front of you, bend your knees so they are both pointed toward the ceiling and  your feet are flat on the floor. Cross your arms over your chest.  Slightly tip your chin down without bending your neck.  Tense your abdominals and slowly lift your trunk high enough to just clear your shoulder blades. Lifting higher can put excessive stress on the low back and does not further strengthen your abdominal muscles.  Control your return to the starting position. Repeat __________ times. Complete this exercise __________ times per day.  STRENGTHENING - Quadruped, Opposite UE/LE Lift  Assume a hands and knees position on a firm surface. Keep your hands under your shoulders and your knees under your hips. You may place padding under your knees for comfort.  Find your neutral spine and gently tense your abdominal muscles so that you can maintain this position. Your shoulders and hips should form a rectangle that is parallel with the floor and is not twisted.  Keeping your trunk steady, lift your right hand no higher than your shoulder and then your left leg no higher than your hip. Make sure you are not holding your breath. Hold this position __________ seconds.  Continuing to keep your abdominal muscles tense and your back steady, slowly return to your starting position. Repeat with the opposite arm and leg. Repeat __________ times. Complete this exercise __________ times per day.  STRENGTHENING - Abdominals and Quadriceps, Straight Leg Raise   Lie on a firm bed or floor with both legs extended in front of you.  Keeping one leg in contact with the floor, bend the other knee so that your foot can rest flat on the floor.  Find your neutral spine, and tense your abdominal muscles to maintain your spinal position throughout the exercise.  Slowly lift your straight leg off the floor about 6 inches for a count of 15, making sure to not hold your breath.  Still keeping your neutral spine, slowly lower your leg all the way to the floor. Repeat this exercise with each leg __________  times. Complete this exercise __________ times per day. POSTURE AND BODY MECHANICS CONSIDERATIONS - Sciatica Keeping correct posture when sitting, standing or completing your activities will reduce the stress put on different body tissues, allowing injured tissues a chance to heal and limiting painful experiences. The following are general guidelines for improved posture. Your physician or physical therapist will provide you with any instructions specific to your needs. While reading these guidelines, remember:  The exercises prescribed by your provider will help you have the flexibility and strength to maintain correct postures.  The correct posture provides the optimal environment for your joints to work. All of your joints have less wear and tear when properly supported by a spine with good posture. This means you will experience a healthier, less painful body.  Correct posture must be practiced with all of your activities, especially prolonged sitting and standing. Correct posture is as important when doing repetitive low-stress activities (typing) as it is when doing a single heavy-load activity (lifting). RESTING POSITIONS Consider which positions are most painful for you when choosing a resting position. If you have pain with flexion-based activities (sitting, bending, stooping, squatting), choose a position that allows you to rest in a less flexed posture. You would want to avoid curling into a fetal position on your side. If your pain worsens with extension-based activities (prolonged standing, working overhead), avoid resting in an extended position such as sleeping on your stomach. Most people will find more comfort when they rest with their spine in a more neutral position, neither too rounded nor too arched. Lying on a non-sagging bed on your side with a pillow between your knees, or on your back with a pillow under your knees will often provide some relief. Keep in mind, being in any one  position for a prolonged period of time, no matter how correct your posture, can still lead to stiffness. PROPER SITTING POSTURE In order to minimize stress and discomfort on your spine, you must sit with correct posture Sitting with good posture should be effortless for a healthy body. Returning to good posture is a gradual process. Many people can work toward this most comfortably by using various supports until they have the flexibility and strength to maintain this posture on their own. When sitting with proper posture, your ears will fall over your shoulders and your shoulders will fall over your hips. You should use the back of the chair to support your upper back. Your low back will be in a neutral position, just slightly arched. You may place a small pillow or folded towel at the base of your low back for support.  When working at a desk, create an environment that supports good, upright posture. Without extra support, muscles fatigue and lead to excessive strain on joints and other tissues. Keep these recommendations in mind: CHAIR:   A chair should be able to slide under your desk when your back makes contact with the back of the chair. This allows you to work closely.  The chair's height should allow your eyes to be level with the upper part of your monitor and your hands to be slightly lower than your elbows. BODY POSITION  Your feet should make contact with the floor. If this is not possible, use a foot rest.  Keep your ears over your shoulders. This will reduce stress on your neck and low back. INCORRECT SITTING POSTURES   If you are feeling tired and unable to assume a healthy sitting posture, do not slouch or slump. This puts excessive strain on your back tissues, causing more damage and pain. Healthier options include:  Using more support, like a lumbar pillow.  Switching tasks to something that requires you to be upright or walking.  Talking a brief walk.  Lying down to  rest in a neutral-spine position. PROLONGED STANDING WHILE SLIGHTLY LEANING FORWARD  When completing a task that requires you to lean forward while standing in one place for a long time, place either foot up on a stationary 2-4 inch high object to help maintain the best posture. When both feet are on the ground, the low back tends to lose its slight inward curve. If this curve flattens (or becomes too large), then the back and your other joints will experience too much stress,  fatigue more quickly and can cause pain.  CORRECT STANDING POSTURES Proper standing posture should be assumed with all daily activities, even if they only take a few moments, like when brushing your teeth. As in sitting, your ears should fall over your shoulders and your shoulders should fall over your hips. You should keep a slight tension in your abdominal muscles to brace your spine. Your tailbone should point down to the ground, not behind your body, resulting in an over-extended swayback posture.  INCORRECT STANDING POSTURES  Common incorrect standing postures include a forward head, locked knees and/or an excessive swayback. WALKING Walk with an upright posture. Your ears, shoulders and hips should all line-up. PROLONGED ACTIVITY IN A FLEXED POSITION When completing a task that requires you to bend forward at your waist or lean over a low surface, try to find a way to stabilize 3 of 4 of your limbs. You can place a hand or elbow on your thigh or rest a knee on the surface you are reaching across. This will provide you more stability so that your muscles do not fatigue as quickly. By keeping your knees relaxed, or slightly bent, you will also reduce stress across your low back. CORRECT LIFTING TECHNIQUES DO :   Assume a wide stance. This will provide you more stability and the opportunity to get as close as possible to the object which you are lifting.  Tense your abdominals to brace your spine; then bend at the knees and  hips. Keeping your back locked in a neutral-spine position, lift using your leg muscles. Lift with your legs, keeping your back straight.  Test the weight of unknown objects before attempting to lift them.  Try to keep your elbows locked down at your sides in order get the best strength from your shoulders when carrying an object.  Always ask for help when lifting heavy or awkward objects. INCORRECT LIFTING TECHNIQUES DO NOT:   Lock your knees when lifting, even if it is a small object.  Bend and twist. Pivot at your feet or move your feet when needing to change directions.  Assume that you cannot safely pick up a paperclip without proper posture. Document Released: 04/07/2005 Document Revised: 08/22/2013 Document Reviewed: 07/20/2008 [facility] Patient Information 2015 [place], [place]. This information is not intended to replace advice given to you by your health care provider. Make sure you discuss any questions you have with your health care provider.

## 2014-08-29 NOTE — Telephone Encounter (Signed)
Called pt to let her know. Left detailed message on her voicemail.

## 2014-08-29 NOTE — Telephone Encounter (Signed)
rtc and see her primary care

## 2014-08-29 NOTE — Progress Notes (Signed)
   Subjective:    Patient ID: Haley Blake, female    DOB: December 20, 1965, 49 y.o.   MRN: [id number]  Chief Complaint  Patient presents with  . Medication Refill    wants referral to a Primary Dr   Patient Active Problem List   Diagnosis Date Noted  . Degeneration of lumbar or lumbosacral intervertebral disc 08/29/2014  . Family history of diabetes mellitus 06/08/2012  . CIN II (cervical intraepithelial neoplasia II) 06/06/2011   Medications, allergies, past medical history, surgical history, family history, social history and problem list reviewed and updated.  HPI  89 yof with pmh lumbar ddd presents needing pain meds refilled for low back pain.   Injured low back 3 yrs ago. 10/13 - Lumbar MRI - DDD L5/S1. Central disc protrusion. No neureal compression. 4/14 Lumbar CT w contrast - chronic ddd L5/S1 with loss of disc height. Annular bulge without apparent neural compression. Per pt she has seen ortho several times in past. They have not recommended surgery. She has had couple epidurals without much relief. Has seen chiro and had decompression therapy without relief. Has been on norco for several months which provides relief. Muscle relaxants have not helped in past. Oral prednisone helped small amnt but not for long. She has never seen PT but does home exercises.   Pain is bilateral low back with intermittent bilateral sciatica sx to knee.   No new injury to area since we last saw her, pain is just chronic and bothersome. Would like norco refill. Denies bowel/bladder dysfx, perianal los. Denies IVD use.   Review of Systems No fevers, chills.     Objective:   Physical Exam  Constitutional: She is oriented to person, place, and time. She appears well-developed and well-nourished.  Non-toxic appearance. She does not have a sickly appearance. She does not appear ill. No distress.  BP 108/66 mmHg  Pulse 72  Temp(Src) 98.1 F (36.7 C) (Oral)  Resp 16  Ht 5\' 5"  (1.651 m)  Wt 135 lb 4 oz  (61.349 kg)  BMI 22.51 kg/m2  SpO2 98%  LMP 08/07/2011   Musculoskeletal:       Thoracic back: She exhibits no tenderness and no bony tenderness.       Lumbar back: She exhibits tenderness. She exhibits no bony tenderness.       Back:  TTP bilateral low back. No spinal ttp. Decreased lumbar rom due to pain. Negative SLR bilaterally.   Neurological: She is alert and oriented to person, place, and time.      Assessment & Plan:   89 yof with pmh lumbar ddd presents needing pain meds refilled for low back pain.   Midline low back pain, with sciatica presence unspecified - Plan: Ambulatory referral to Physical Therapy Lumbar pain with radiation down right leg - Plan: HYDROcodone-acetaminophen (NORCO/VICODIN) 5-325 MG per tablet, Ambulatory referral to Physical Therapy --Pt has tried numerous avenues without much relief --she has not tried formal PT yet, discussed this option with pt and she is agreeable to see them for possible new exercises and advice, referral placed --no cauda equina sx --norco refilled, [facility] policy reviewed with pt, she is to choose one provider and follow with them for meds --possible referral to see ortho again if sx persist  Donnajean Lopesodd M. Tyland Klemens, PA-C Physician Assistant-Certified [facility]  08/29/2014 9:03 PM

## 2015-01-23 ENCOUNTER — Encounter: Payer: Self-pay | Admitting: Emergency Medicine

## 2015-04-02 ENCOUNTER — Other Ambulatory Visit: Payer: Self-pay

## 2015-04-02 DIAGNOSIS — Z1231 Encounter for screening mammogram for malignant neoplasm of breast: Secondary | ICD-10-CM

## 2015-05-01 ENCOUNTER — Ambulatory Visit
Admission: RE | Admit: 2015-05-01 | Discharge: 2015-05-01 | Disposition: A | Discharge status: Home / Self Care | Payer: BLUE CROSS/BLUE SHIELD | Source: Ambulatory Visit

## 2015-05-01 DIAGNOSIS — Z1231 Encounter for screening mammogram for malignant neoplasm of breast: Secondary | ICD-10-CM

## 2016-09-03 ENCOUNTER — Encounter: Payer: Self-pay | Admitting: Gynecology

## 2017-10-14 IMAGING — MG MM SCREENING BREAST TOMO BILATERAL
9 series · 9 of 25 positions shown · non-contrast
Comparison: Previous exam(s).

CLINICAL DATA: Screening.

EXAM:
DIGITAL SCREENING BILATERAL MAMMOGRAM WITH 3D TOMO WITH CAD

[R MLO (1 of 2)]
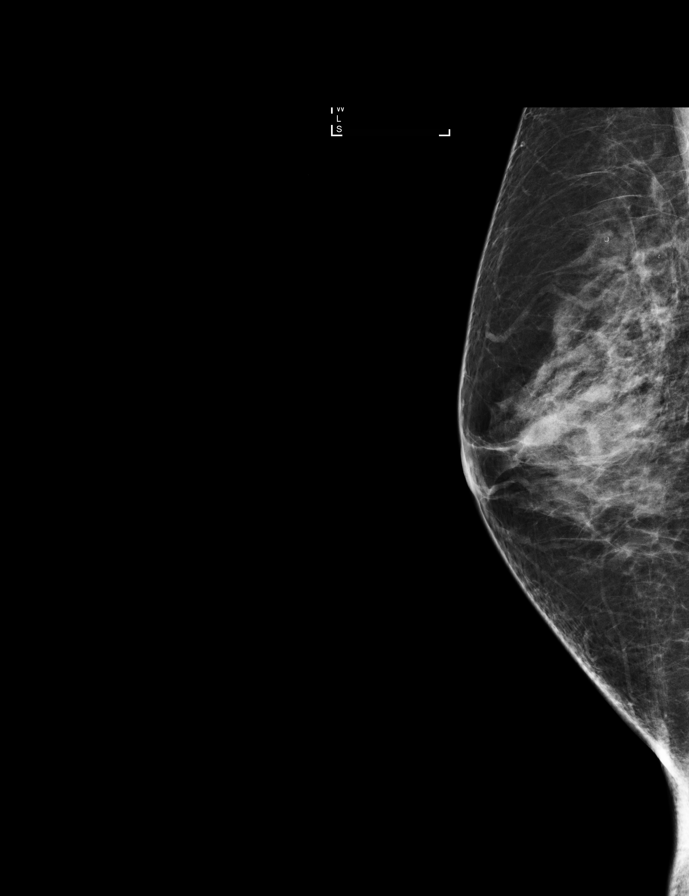

[L MLO]
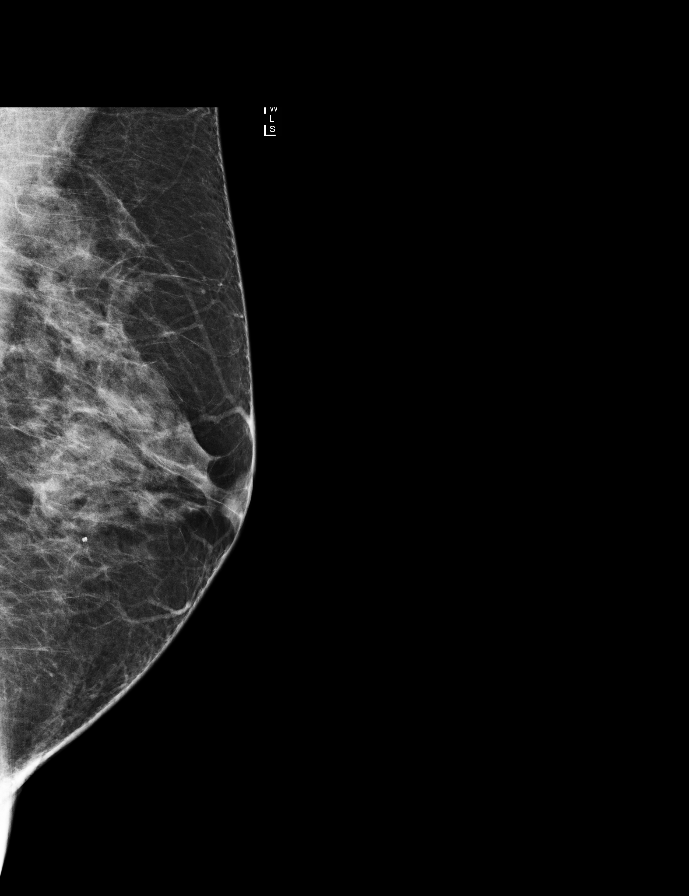

[L CC]
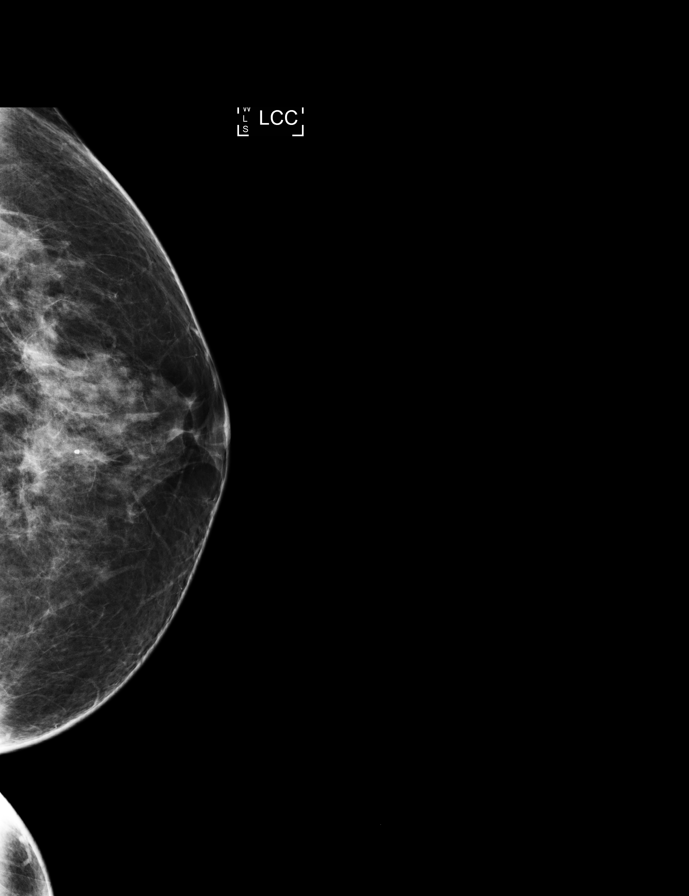

[R CC]
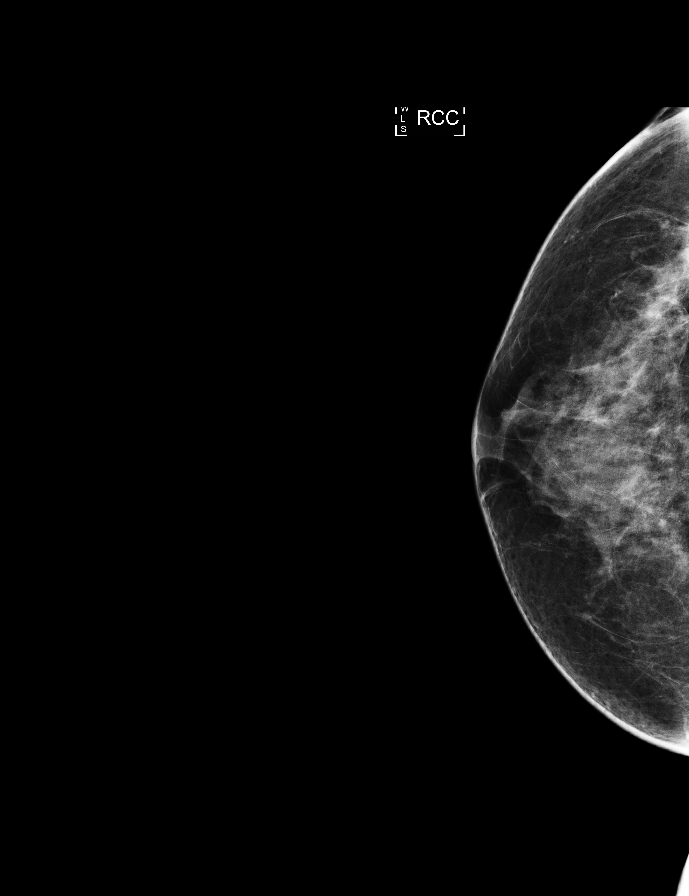

[R MLO (2 of 2)]
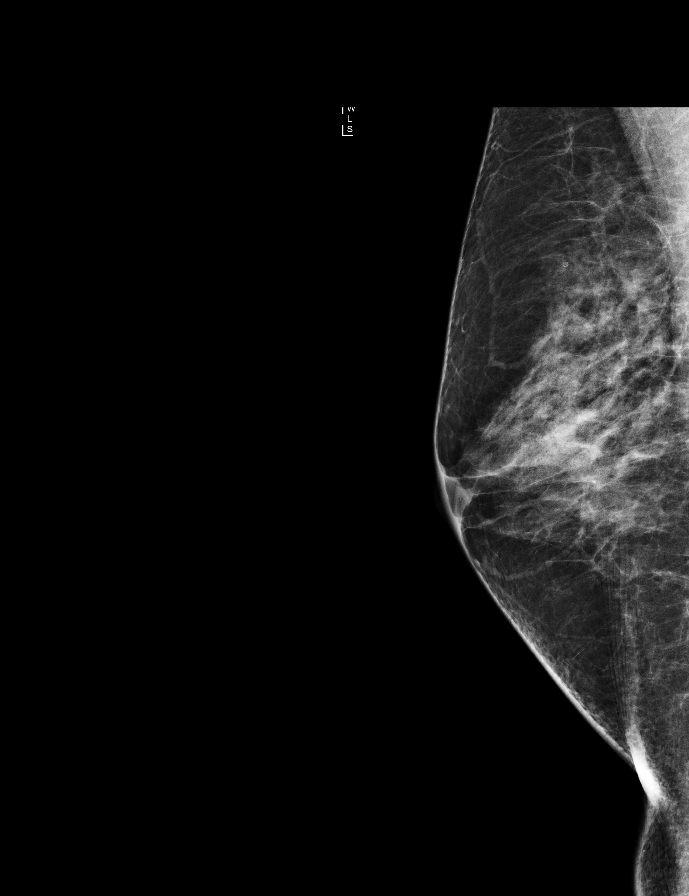

[L CC tomo · tomo slice 39/77.0]
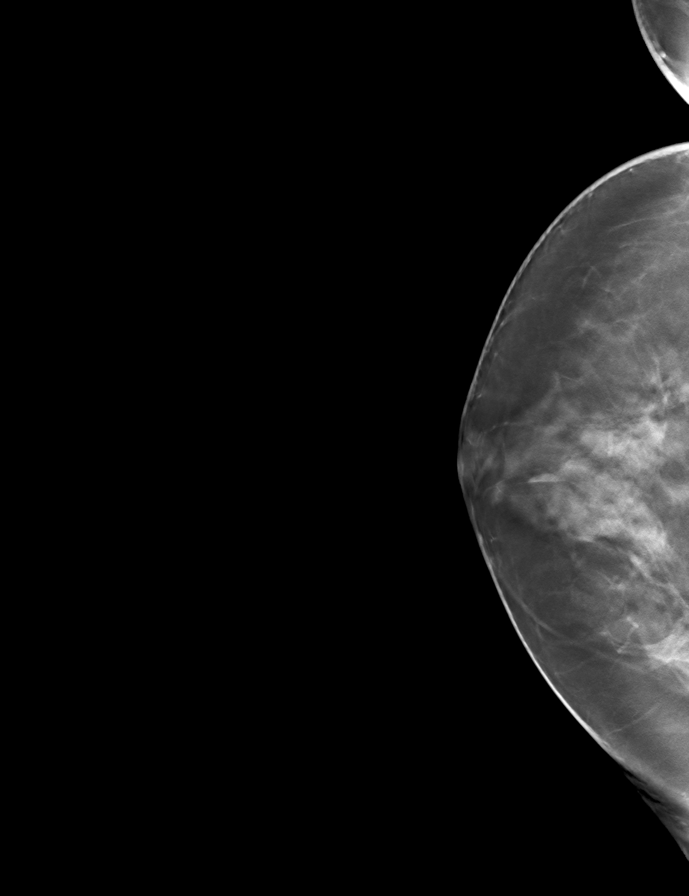

[R CC tomo · tomo slice 37/74.0]
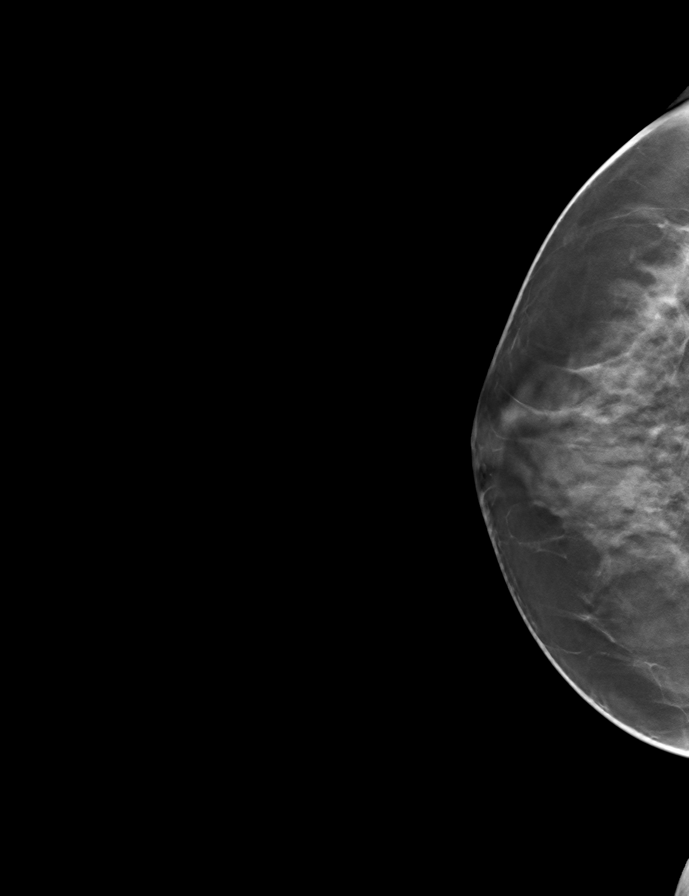

[L MLO tomo · tomo slice 36/71.0]
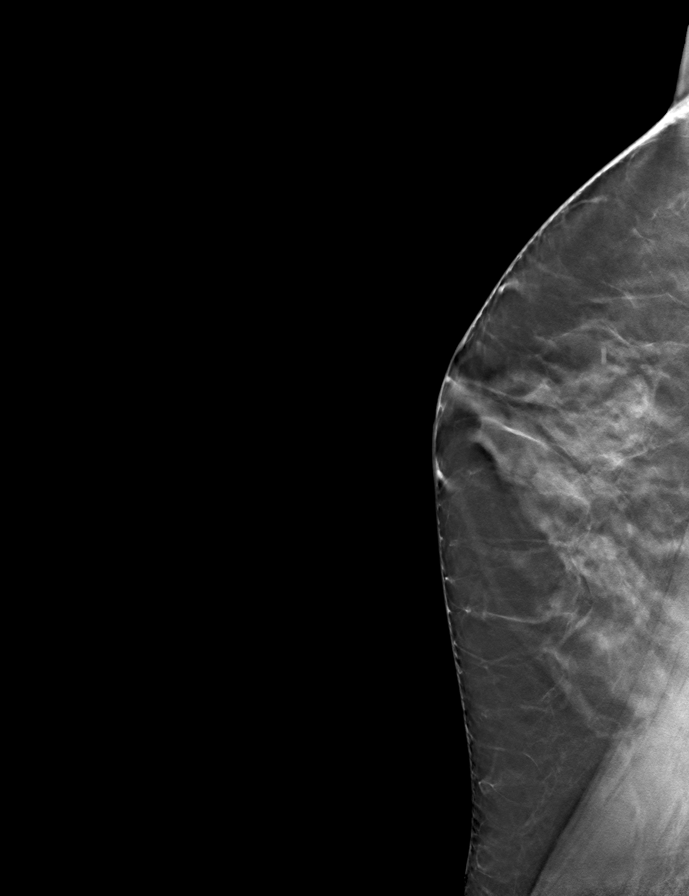

[R MLO tomo · tomo slice 35/69.0]
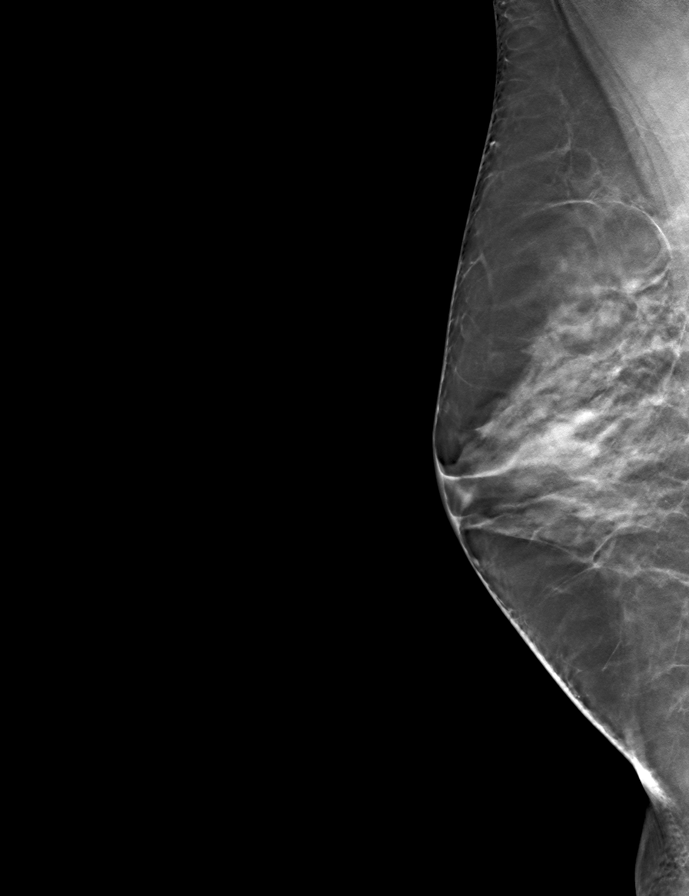

[9 of 25 positions shown; findings below may reference images not displayed]

ACR Breast Density Category c: The breast tissue is heterogeneously
dense, which may obscure small masses.
FINDINGS: There are no findings suspicious for malignancy. Images were
processed with CAD.
IMPRESSION: No mammographic evidence of malignancy. A result letter of this
screening mammogram will be mailed directly to the patient.

RECOMMENDATION:
Screening mammogram in one year. (Code:[id number])

BI-RADS CATEGORY  1: Negative.

## 2019-07-15 ENCOUNTER — Ambulatory Visit: Payer: Self-pay | Attending: Internal Medicine

## 2019-07-15 DIAGNOSIS — Z23 Encounter for immunization: Secondary | ICD-10-CM

## 2019-07-15 NOTE — Progress Notes (Signed)
   Covid-19 Vaccination Clinic  Name:  Haley Blake    MRN: [id number] DOB: Nov 19, 1965  07/15/2019  [name] was observed post Covid-19 immunization for 15 minutes without incident. She was provided with Vaccine Information Sheet and instruction to access the V-Safe system.   [name] was instructed to call 911 with any severe reactions post vaccine: Marland Kitchen Difficulty breathing  . Swelling of face and throat  . A fast heartbeat  . A bad rash all over body  . Dizziness and weakness   Immunizations Administered    Name Date Dose VIS Date Route   Pfizer COVID-19 Vaccine 07/15/2019  9:50 AM 0.3 mL 04/01/2019 Intramuscular   Manufacturer: [employer], [employer]   Lot: [id number]   NDC: [address]-8

## 2019-08-08 ENCOUNTER — Ambulatory Visit: Payer: Self-pay | Attending: Internal Medicine

## 2019-08-08 DIAGNOSIS — Z23 Encounter for immunization: Secondary | ICD-10-CM

## 2019-08-08 NOTE — Progress Notes (Signed)
   Covid-19 Vaccination Clinic  Name:  Alyce Inscore    MRN: [id number] DOB: 10/07/1965  08/08/2019  Ms. Maciolek was observed post Covid-19 immunization for 15 minutes without incident. She was provided with Vaccine Information Sheet and instruction to access the V-Safe system.   Ms. Dutson was instructed to call 911 with any severe reactions post vaccine: Marland Kitchen Difficulty breathing  . Swelling of face and throat  . A fast heartbeat  . A bad rash all over body  . Dizziness and weakness   Immunizations Administered    Name Date Dose VIS Date Route   Pfizer COVID-19 Vaccine 08/08/2019  1:33 PM 0.3 mL 06/15/2018 Intramuscular   Manufacturer: [employer], [employer]   Lot: [id number]   NDC: [address]-5

## 2021-01-03 ENCOUNTER — Encounter (HOSPITAL_BASED_OUTPATIENT_CLINIC_OR_DEPARTMENT_OTHER): Payer: Self-pay | Admitting: Emergency Medicine

## 2021-01-03 ENCOUNTER — Emergency Department (HOSPITAL_BASED_OUTPATIENT_CLINIC_OR_DEPARTMENT_OTHER): Payer: BC Managed Care – PPO

## 2021-01-03 ENCOUNTER — Other Ambulatory Visit: Payer: Self-pay

## 2021-01-03 ENCOUNTER — Emergency Department (HOSPITAL_BASED_OUTPATIENT_CLINIC_OR_DEPARTMENT_OTHER)
Admission: EM | Admit: 2021-01-03 | Discharge: 2021-01-03 | Disposition: A | Discharge status: Home / Self Care | Payer: BC Managed Care – PPO | Attending: Emergency Medicine | Admitting: Emergency Medicine

## 2021-01-03 DIAGNOSIS — R1032 Left lower quadrant pain: Secondary | ICD-10-CM | POA: Diagnosis not present

## 2021-01-03 DIAGNOSIS — F17210 Nicotine dependence, cigarettes, uncomplicated: Secondary | ICD-10-CM | POA: Insufficient documentation

## 2021-01-03 DIAGNOSIS — R1030 Lower abdominal pain, unspecified: Secondary | ICD-10-CM

## 2021-01-03 DIAGNOSIS — R1031 Right lower quadrant pain: Secondary | ICD-10-CM | POA: Diagnosis not present

## 2021-01-03 HISTORY — DX: Hyperlipidemia, unspecified: E78.5

## 2021-01-03 LAB — COMPREHENSIVE METABOLIC PANEL
ALT: 29 U/L (ref 0–44)
AST: 30 U/L (ref 15–41)
Albumin: 4.4 g/dL (ref 3.5–5.0)
Alkaline Phosphatase: 82 U/L (ref 38–126)
Anion gap: 7 (ref 5–15)
BUN: 17 mg/dL (ref 6–20)
CO2: 26 mmol/L (ref 22–32)
Calcium: 9.2 mg/dL (ref 8.9–10.3)
Chloride: 105 mmol/L (ref 98–111)
Creatinine, Ser: 1.03 mg/dL — ABNORMAL HIGH (ref 0.44–1.00)
GFR, Estimated: >60 mL/min (ref >60)
Glucose, Bld: 100 mg/dL — ABNORMAL HIGH (ref 70–99)
Potassium: 4.1 mmol/L (ref 3.5–5.1)
Sodium: 138 mmol/L (ref 135–145)
Total Bilirubin: 0.5 mg/dL (ref 0.3–1.2)
Total Protein: 7.4 g/dL (ref 6.5–8.1)

## 2021-01-03 LAB — CBC
HCT: 39.4 % (ref 36.0–46.0)
Hemoglobin: 13.3 g/dL (ref 12.0–15.0)
MCH: 27.9 pg (ref 26.0–34.0)
MCHC: 33.8 g/dL (ref 30.0–36.0)
MCV: 82.8 fL (ref 80.0–100.0)
Platelets: 230 10*3/uL (ref 150–400)
RBC: 4.76 MIL/uL (ref 3.87–5.11)
RDW: 12.4 % (ref 11.5–15.5)
WBC: 7.1 10*3/uL (ref 4.0–10.5)
nRBC: 0 % (ref 0.0–0.2)

## 2021-01-03 LAB — URINALYSIS, ROUTINE W REFLEX MICROSCOPIC
Bilirubin Urine: NEGATIVE
Glucose, UA: NEGATIVE mg/dL
Ketones, ur: NEGATIVE mg/dL
Leukocytes,Ua: NEGATIVE
Nitrite: NEGATIVE
Protein, ur: NEGATIVE mg/dL
Specific Gravity, Urine: 1.02 (ref 1.005–1.030)
pH: 7 (ref 5.0–8.0)

## 2021-01-03 LAB — URINALYSIS, MICROSCOPIC (REFLEX): WBC, UA: NONE SEEN WBC/hpf (ref 0–5)

## 2021-01-03 LAB — LIPASE, BLOOD: Lipase: 47 U/L (ref 11–51)

## 2021-01-03 MED ORDER — OXYCODONE-ACETAMINOPHEN 5-325 MG PO TABS
1.0000 | ORAL_TABLET | ORAL | Status: DC | PRN
  Administered 2021-01-03: 1 via ORAL
  Filled 2021-01-03: qty 1

## 2021-01-03 MED ORDER — OXYCODONE HCL 5 MG PO TABS: 5.0000 mg | ORAL_TABLET | Freq: Four times a day (QID) | ORAL | 0 refills | Status: AC | PRN

## 2021-01-03 MED ORDER — IOHEXOL 350 MG/ML SOLN
85.0000 mL | Freq: Once | INTRAVENOUS | Status: AC | PRN
  Administered 2021-01-03: 85 mL via INTRAVENOUS

## 2021-01-03 NOTE — ED Triage Notes (Signed)
Pt states abdominal pain X 3 hours started after urinating. And gradually has gotten worse.

## 2021-01-03 NOTE — Discharge Instructions (Addendum)
Please read and follow all provided instructions.  Your diagnoses today include:  1. Lower abdominal pain     Tests performed today include: Blood cell counts and platelets Kidney and liver function tests Pancreas function test (called lipase) Urine test to look for infection CT scan of your abdomen and pelvis - shows some enlarged veins around the uterus but no other concerning findings Vital signs. See below for your results today.   Medications prescribed:  Oxycodone - narcotic pain medication  DO NOT drive or perform any activities that require you to be awake and alert because this medicine can make you drowsy.   Take any prescribed medications only as directed.  Home care instructions:  Follow any educational materials contained in this packet.  Follow-up instructions: Please follow-up with your primary care provider in the next 3 days for further evaluation of your symptoms.    Return instructions:  SEEK IMMEDIATE MEDICAL ATTENTION IF: The pain does not go away or becomes severe  A temperature above 101F develops  Repeated vomiting occurs (multiple episodes)  The pain becomes localized to portions of the abdomen. The right side could possibly be appendicitis. In an adult, the left lower portion of the abdomen could be colitis or diverticulitis.  Blood is being passed in stools or vomit (bright red or black tarry stools)  You develop chest pain, difficulty breathing, dizziness or fainting, or become confused, poorly responsive, or inconsolable (young children) If you have any other emergent concerns regarding your health  Additional Information: Abdominal (belly) pain can be caused by many things. Your caregiver performed an examination and possibly ordered blood/urine tests and imaging (CT scan, x-rays, ultrasound). Many cases can be observed and treated at home after initial evaluation in the emergency department. Even though you are being discharged home, abdominal pain  can be unpredictable. Therefore, you need a repeated exam if your pain does not resolve, returns, or worsens. Most patients with abdominal pain don't have to be admitted to the hospital or have surgery, but serious problems like appendicitis and gallbladder attacks can start out as nonspecific pain. Many abdominal conditions cannot be diagnosed in one visit, so follow-up evaluations are very important.  Your vital signs today were: BP 120/69 (BP Location: Left Arm)   Pulse 68   Temp 98.4 F (36.9 C) (Oral)   Resp 16   Ht 5\' 5"  (1.651 m)   Wt 66.7 kg   SpO2 100%   BMI 24.46 kg/m  If your blood pressure (bp) was elevated above 135/85 this visit, please have this repeated by your doctor within one month. --------------

## 2021-01-03 NOTE — ED Provider Notes (Signed)
[facility] EMERGENCY DEPARTMENT Provider Note   CSN: [id number] Arrival date & time: 01/03/21  1932     History Chief Complaint  Patient presents with   Abdominal Pain    Haley Blake is a 55 y.o. female.  Patient with no past abdominal surgical history presents the emergency department for evaluation of bilateral lower abdominal pain.  Symptoms started around 3:30 PM today.  Pain became severe at times.  No associated fevers, chest pain, shortness of breath, nausea, vomiting, diarrhea, constipation.  No urinary symptoms.  Patient went home from work where she was when the pain started, took ibuprofen, but pain persisted.  She talked with her daughters who are RNs.  They convinced her to come to the emergency department.  She was given Percocet on arrival which has helped a little bit.  Pain currently 7 out of 10. The onset of this condition was acute. The course is constant. Aggravating factors: none. Alleviating factors: none.        Past Medical History:  Diagnosis Date   Hyperlipidemia     There are no problems to display for this patient.   History reviewed. No pertinent surgical history.   OB History   No obstetric history on file.     History reviewed. No pertinent family history.  Social History   Tobacco Use   Smoking status: Some Days    Types: Cigarettes   Smokeless tobacco: Never  Vaping Use   Vaping Use: Never used  Substance Use Topics   Alcohol use: Never   Drug use: Never    Home Medications Prior to Admission medications   Medication Sig Start Date End Date Taking? Authorizing Provider  ibandronate (BONIVA) 150 MG tablet Take 150 mg by mouth every 30 (thirty) days. Take in the morning with a full glass of water, on an empty stomach, and do not take anything else by mouth or lie down for the next 30 min.   Yes [provider]  pravastatin (PRAVACHOL) 10 MG tablet Take 10 mg by mouth daily.   Yes [provider]     Allergies    Patient has no known allergies.  Review of Systems   Review of Systems  Constitutional:  Negative for fever.  HENT:  Negative for rhinorrhea and sore throat.   Eyes:  Negative for redness.  Respiratory:  Negative for cough.   Cardiovascular:  Negative for chest pain.  Gastrointestinal:  Positive for abdominal pain. Negative for diarrhea, nausea and vomiting.  Genitourinary:  Negative for dysuria, frequency, hematuria and urgency.  Musculoskeletal:  Negative for myalgias.  Skin:  Negative for rash.  Neurological:  Negative for headaches.   Physical Exam Updated Vital Signs BP 116/78 (BP Location: Right Arm)   Pulse 73   Temp 98.4 F (36.9 C) (Oral)   Resp 20   Ht 5\' 5"  (1.651 m)   Wt 66.7 kg   SpO2 100%   BMI 24.46 kg/m   Physical Exam Vitals and nursing note reviewed.  Constitutional:      General: She is not in acute distress.    Appearance: She is well-developed.  HENT:     Head: Normocephalic and atraumatic.     Right Ear: External ear normal.     Left Ear: External ear normal.     Nose: Nose normal.  Eyes:     Conjunctiva/sclera: Conjunctivae normal.  Cardiovascular:     Rate and Rhythm: Normal rate and regular rhythm.  Heart sounds: No murmur heard. Pulmonary:     Effort: No respiratory distress.     Breath sounds: No wheezing, rhonchi or rales.  Abdominal:     Palpations: Abdomen is soft.     Tenderness: There is abdominal tenderness in the right lower quadrant, suprapubic area and left lower quadrant. There is no guarding or rebound.  Musculoskeletal:     Cervical back: Normal range of motion and neck supple.     Right lower leg: No edema.     Left lower leg: No edema.  Skin:    General: Skin is warm and dry.     Findings: No rash.  Neurological:     General: No focal deficit present.     Mental Status: She is alert. Mental status is at baseline.     Motor: No weakness.  Psychiatric:        Mood and Affect: Mood normal.     ED Results / Procedures / Treatments   Labs (all labs ordered are listed, but only abnormal results are displayed) Labs Reviewed  COMPREHENSIVE METABOLIC PANEL - Abnormal; Notable for the following components:      Result Value   Glucose, Bld 100 (*)    Creatinine, Ser 1.03 (*)    All other components within normal limits  URINALYSIS, ROUTINE W REFLEX MICROSCOPIC - Abnormal; Notable for the following components:   Hgb urine dipstick SMALL (*)    All other components within normal limits  URINALYSIS, MICROSCOPIC (REFLEX) - Abnormal; Notable for the following components:   Bacteria, UA RARE (*)    All other components within normal limits  LIPASE, BLOOD  CBC    EKG None  Radiology No results found.  Procedures Procedures   Medications Ordered in ED Medications  oxyCODONE-acetaminophen (PERCOCET/ROXICET) 5-325 MG per tablet 1 tablet (1 tablet Oral Given 01/03/21 1956)    ED Course  I have reviewed the triage vital signs and the nursing notes.  Pertinent labs & imaging results that were available during my care of the patient were reviewed by me and considered in my medical decision making (see chart for details).  Patient seen and examined. Work-up initiated.  Urine largely unremarkable.  Will obtain labs and CT imaging.  Patient declines further pain medication at this time.  Vital signs reviewed and are as follows: BP 116/78 (BP Location: Right Arm)   Pulse 73   Temp 98.4 F (36.9 C) (Oral)   Resp 20   Ht 5\' 5"  (1.651 m)   Wt 66.7 kg   SpO2 100%   BMI 24.46 kg/m   11:19 PM work-up and CT are reassuring.  Patient updated on results including signs of dilated veins around the uterus.  Discussed that this is unlikely the cause of her pain tonight and she just be followed up by her primary care doctor or GYN.  Patient verbalizes understanding agrees with plan.  She states that her pain is improved after medication.  Patient counseled on use of narcotic pain  medications. Counseled not to combine these medications with others containing tylenol. Urged not to drink alcohol, drive, or perform any other activities that requires focus while taking these medications. The patient verbalizes understanding and agrees with the plan.    MDM Rules/Calculators/A&P                           Patient with abdominal pain, lower abdominal pain. Vitals are stable, no fever. Labs reassuring.  Imaging CT neg. No signs of dehydration, patient is tolerating PO's. Lungs are clear and no signs suggestive of PNA. Low concern for appendicitis, cholecystitis, pancreatitis, ruptured viscus, UTI, kidney stone, aortic dissection, aortic aneurysm or other emergent abdominal etiology. Supportive therapy indicated with return if symptoms worsen.     Final Clinical Impression(s) / ED Diagnoses Final diagnoses:  Lower abdominal pain    Rx / DC Orders ED Discharge Orders          Ordered    oxyCODONE (OXY IR/ROXICODONE) 5 MG immediate release tablet  Every 6 hours PRN        01/03/21 2321             Renne Crigler, PA-C 01/03/21 2322    Tegeler, Canary Brim, MD 01/03/21 2352

## 2023-06-19 IMAGING — CT CT ABD-PELV W/ CM
2 of 5 series · 16 of 46 positions shown, 18 images · IV contrast (Omnipaque)
Comparison: March 05, 2010

CLINICAL DATA: Lower abdominal pain.

EXAM:
CT ABDOMEN AND PELVIS WITH CONTRAST
TECHNIQUE: Multidetector CT imaging of the abdomen and pelvis was performed
using the standard protocol following bolus administration of
intravenous contrast.
CONTRAST:  85mL OMNIPAQUE IOHEXOL 350 MG/ML SOLN

[Series 2: axial st · axial · 0.94mm/px · z∈[+771,+1166]mm · 13 of 89 slices shown, 15 images]
[im 5/89  soft-tissue]
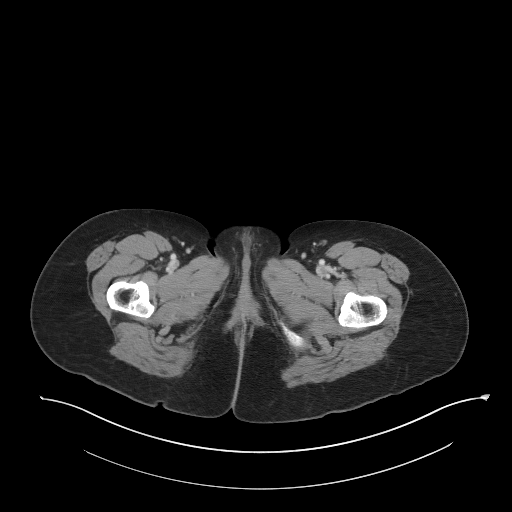
[im 5/89  bone]
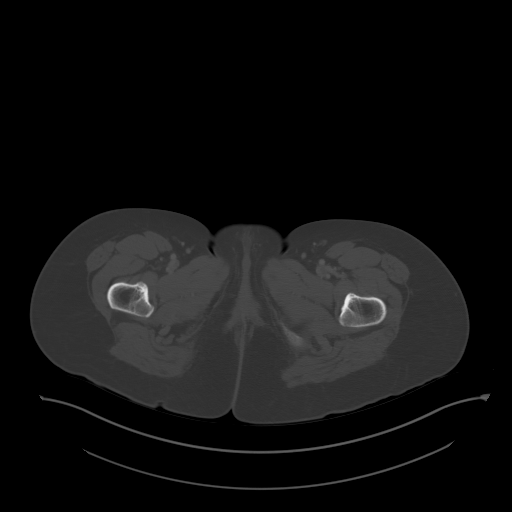
[im 14/89  soft-tissue]
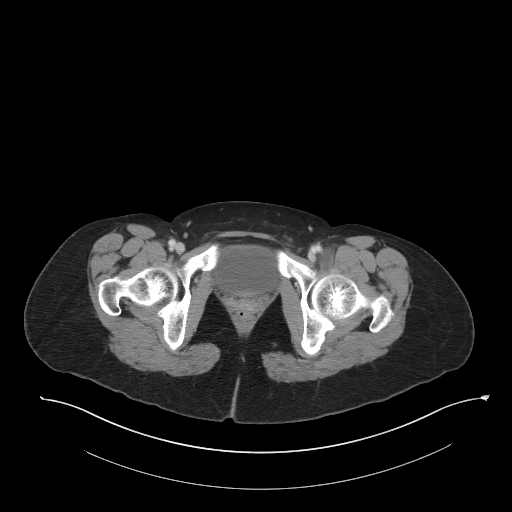
[im 19/89  soft-tissue]
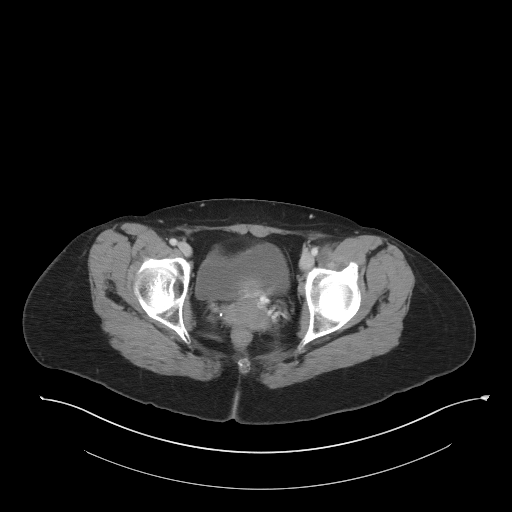
[im 24/89  soft-tissue]
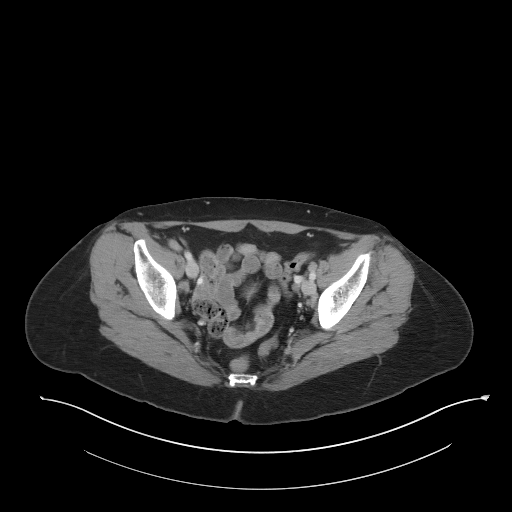
[im 33/89  soft-tissue]
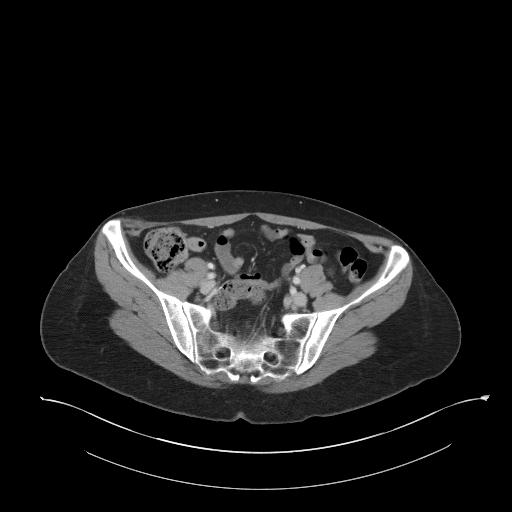
[im 38/89  soft-tissue]
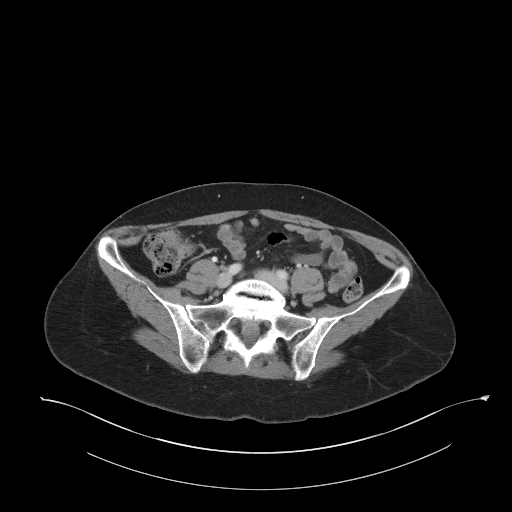
[im 47/89  soft-tissue]
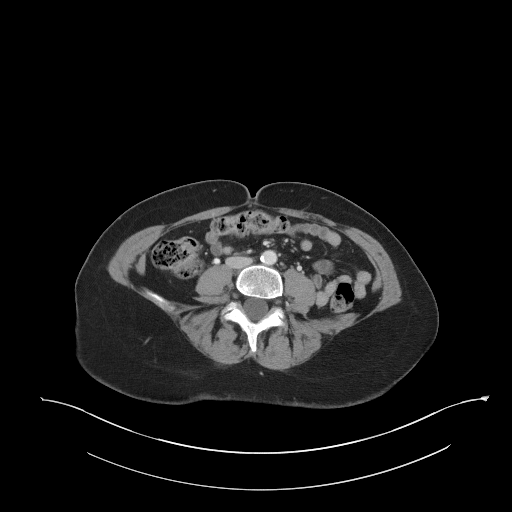
[im 51/89  soft-tissue]
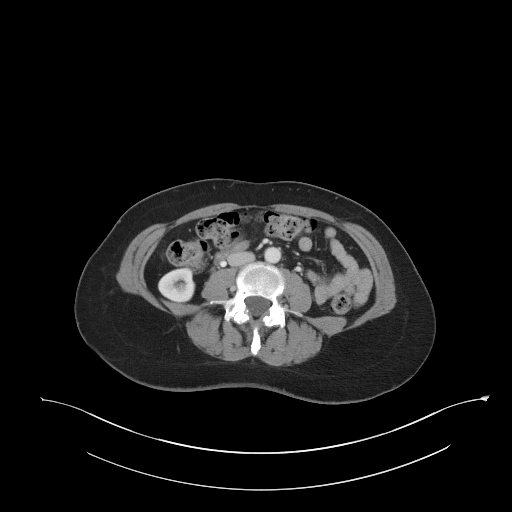
[im 56/89  soft-tissue]
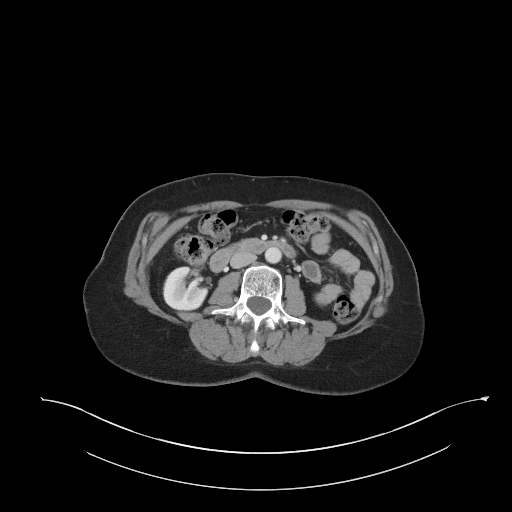
[im 56/89  bone]
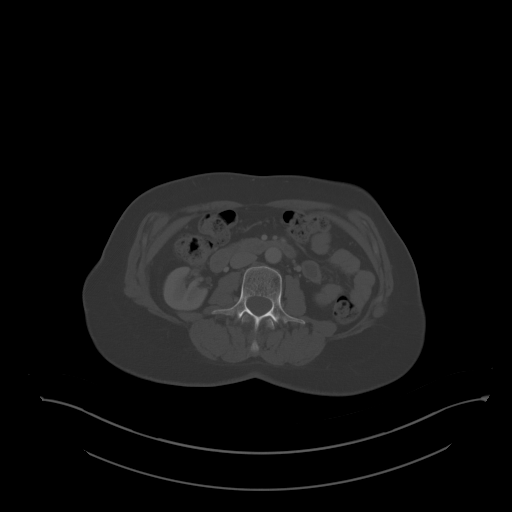
[im 65/89  soft-tissue]
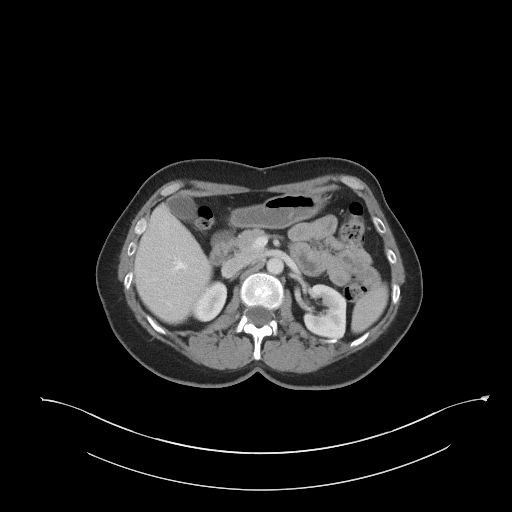
[im 70/89  soft-tissue]
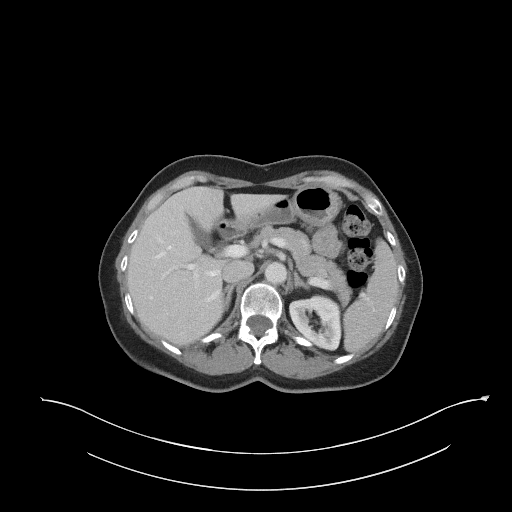
[im 75/89  soft-tissue]
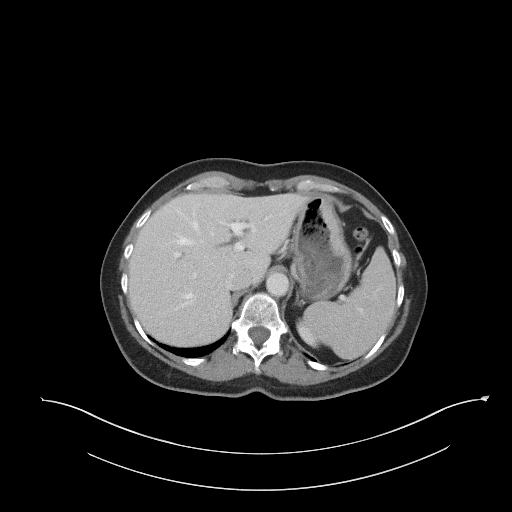
[im 84/89  soft-tissue]
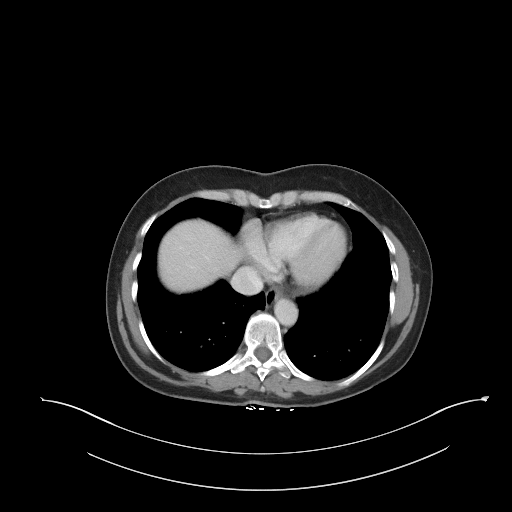

[Series 5: coronal st · coronal · 0.68mm/px · 3 of 77 slices shown]
[im 26/77  soft-tissue]
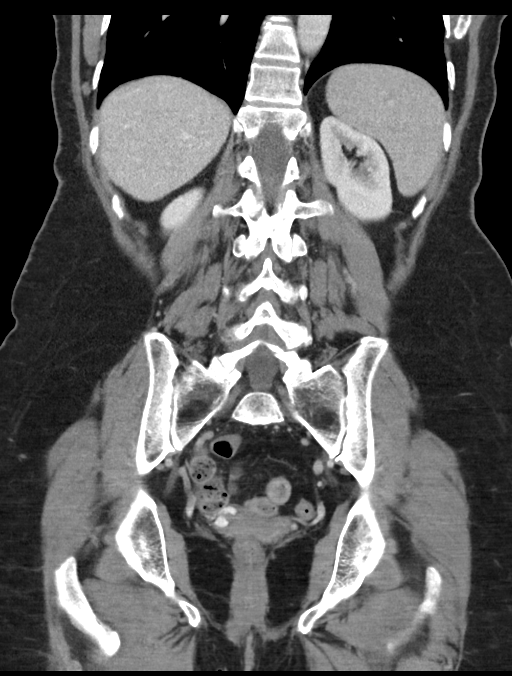
[im 34/77  soft-tissue]
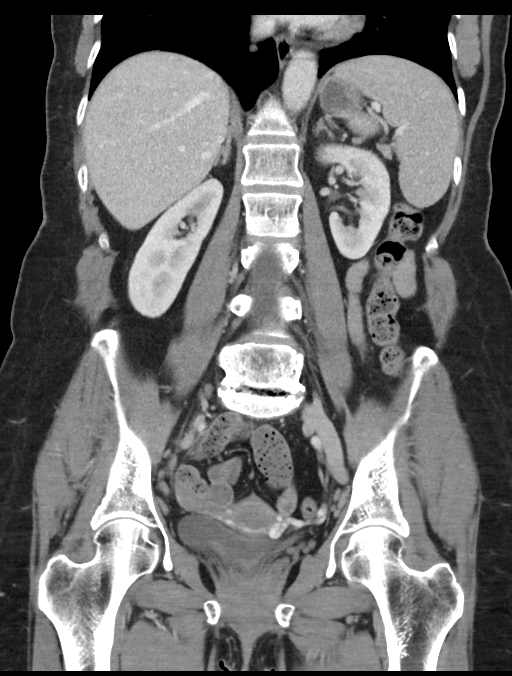
[im 43/77  soft-tissue]
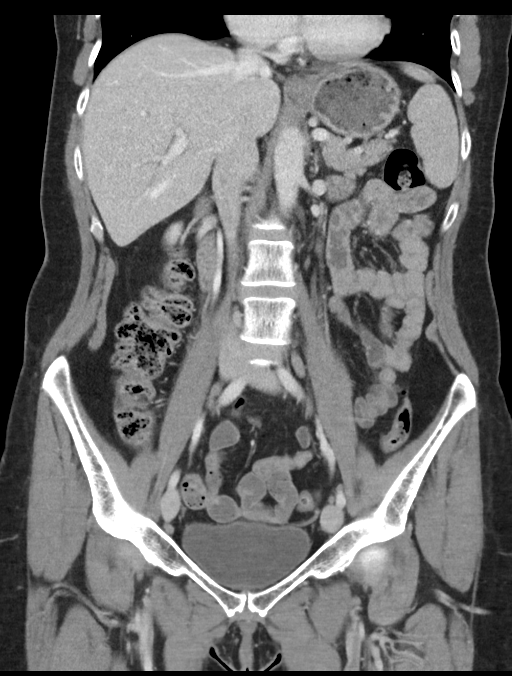

[16 of 46 positions shown; findings below may reference images not displayed]

FINDINGS: Lower chest: No acute abnormality.

Hepatobiliary: No focal liver abnormality is seen. No gallstones,
gallbladder wall thickening, or biliary dilatation.

Pancreas: Unremarkable. No pancreatic ductal dilatation or
surrounding inflammatory changes.

Spleen: Normal in size without focal abnormality.

Adrenals/Urinary Tract: Adrenal glands are unremarkable. Kidneys are
normal, without renal calculi, focal lesion, or hydronephrosis.
Bladder is unremarkable.

Stomach/Bowel: Stomach is within normal limits. The appendix is not
visualized. No evidence of bowel wall thickening, distention, or
inflammatory changes.

Vascular/Lymphatic: There is mild calcification of the abdominal
aorta and bilateral common iliac arteries. No enlarged abdominal or
pelvic lymph nodes.

A 2.7 cm x 1.8 cm well-defined, benign-appearing cystic appearing
area (approximately 22.91 Hounsfield units) is seen anterior to the
left acetabulum (axial CT image 73, CT series 2).

Reproductive: Tortuous vessels are seen along the lateral aspects of
an otherwise normal appearing uterus. The bilateral adnexa are
unremarkable.

Other: No abdominal wall hernia or abnormality. No abdominopelvic
ascites.

Musculoskeletal: Degenerative changes are seen at the level of
L5-S1.
IMPRESSION: 1. Tortuous vessels adjacent to the uterus which may represent
pelvic congestion syndrome.
2. Degenerative changes at the level of L5-S1.
3. Aortic atherosclerosis.

Aortic Atherosclerosis ([id number]).
